# Patient Record
Sex: Male | Born: 1970 | Race: Black or African American | Hispanic: No | Marital: Married | Smoking: Never smoker
Health system: Southern US, Community
[De-identification: ages and names within clinical notes are randomized; demographics above are authoritative.]

## PROBLEM LIST (undated history)

## (undated) DIAGNOSIS — I10 Essential (primary) hypertension: Secondary | ICD-10-CM

## (undated) HISTORY — PX: LAPAROSCOPIC GASTRIC BANDING: SHX1100

## (undated) HISTORY — DX: Essential (primary) hypertension: I10

---

## 2006-07-25 ENCOUNTER — Ambulatory Visit: Payer: Self-pay | Admitting: Family Medicine

## 2006-12-06 ENCOUNTER — Ambulatory Visit: Payer: Self-pay | Admitting: Family Medicine

## 2006-12-13 ENCOUNTER — Ambulatory Visit: Payer: Self-pay | Admitting: Family Medicine

## 2014-04-18 ENCOUNTER — Emergency Department: Payer: Self-pay | Admitting: Emergency Medicine

## 2014-04-24 DIAGNOSIS — R635 Abnormal weight gain: Secondary | ICD-10-CM | POA: Insufficient documentation

## 2014-12-16 DIAGNOSIS — Z9884 Bariatric surgery status: Secondary | ICD-10-CM | POA: Insufficient documentation

## 2014-12-16 DIAGNOSIS — R632 Polyphagia: Secondary | ICD-10-CM | POA: Insufficient documentation

## 2015-09-17 ENCOUNTER — Encounter: Payer: Self-pay | Admitting: Internal Medicine

## 2015-10-06 ENCOUNTER — Encounter: Payer: Self-pay | Admitting: Internal Medicine

## 2015-10-13 ENCOUNTER — Encounter: Payer: Self-pay | Admitting: Internal Medicine

## 2017-12-07 DIAGNOSIS — I10 Essential (primary) hypertension: Secondary | ICD-10-CM | POA: Insufficient documentation

## 2017-12-14 ENCOUNTER — Ambulatory Visit: Payer: Self-pay | Admitting: Internal Medicine

## 2017-12-14 VITALS — BP 162/84 | HR 98 | Temp 98.9°F | Ht 69.0 in | Wt 294.4 lb

## 2017-12-14 DIAGNOSIS — F419 Anxiety disorder, unspecified: Secondary | ICD-10-CM

## 2017-12-14 DIAGNOSIS — I10 Essential (primary) hypertension: Secondary | ICD-10-CM

## 2017-12-14 DIAGNOSIS — F329 Major depressive disorder, single episode, unspecified: Secondary | ICD-10-CM

## 2017-12-14 DIAGNOSIS — F32A Depression, unspecified: Secondary | ICD-10-CM | POA: Insufficient documentation

## 2017-12-14 MED ORDER — AMLODIPINE BESYLATE 5 MG PO TABS: 5.0000 mg | ORAL_TABLET | Freq: Every day | ORAL | 0 refills | Status: DC

## 2017-12-14 MED ORDER — TELMISARTAN-HCTZ 80-25 MG PO TABS: 1.0000 | ORAL_TABLET | Freq: Every day | ORAL | 0 refills | Status: DC

## 2017-12-14 NOTE — Patient Instructions (Addendum)
Referred to the [facility] for counseling [employer]. [address] [phone]  Follow up with me 01/11/18  Take care   Hypertension Hypertension is another name for high blood pressure. High blood pressure forces your heart to work harder to pump blood. This can cause problems over time. There are two numbers in a blood pressure reading. There is a top number (systolic) over a bottom number (diastolic). It is best to have a blood pressure below 120/80. Healthy choices can help lower your blood pressure. You may need medicine to help lower your blood pressure if:  Your blood pressure cannot be lowered with healthy choices.  Your blood pressure is higher than 130/80.  Follow these instructions at home: Eating and drinking  If directed, follow the DASH eating plan. This diet includes: ? Filling half of your plate at each meal with fruits and vegetables. ? Filling one quarter of your plate at each meal with whole grains. Whole grains include whole wheat pasta, brown rice, and whole grain bread. ? Eating or drinking low-fat dairy products, such as skim milk or low-fat yogurt. ? Filling one quarter of your plate at each meal with low-fat (lean) proteins. Low-fat proteins include fish, skinless chicken, eggs, beans, and tofu. ? Avoiding fatty meat, cured and processed meat, or chicken with skin. ? Avoiding premade or processed food.  Eat less than 1,500 mg of salt (sodium) a day.  Limit alcohol use to no more than 1 drink a day for nonpregnant women and 2 drinks a day for men. One drink equals 12 oz of beer, 5 oz of wine, or 1 oz of hard liquor. Lifestyle  Work with your doctor to stay at a healthy weight or to lose weight. Ask your doctor what the best weight is for you.  Get at least 30 minutes of exercise that causes your heart to beat faster (aerobic exercise) most days of the week. This may include walking, swimming, or biking.  Get at least 30  minutes of exercise that strengthens your muscles (resistance exercise) at least 3 days a week. This may include lifting weights or pilates.  Do not use any products that contain nicotine or tobacco. This includes cigarettes and e-cigarettes. If you need help quitting, ask your doctor.  Check your blood pressure at home as told by your doctor.  Keep all follow-up visits as told by your doctor. This is important. Medicines  Take over-the-counter and prescription medicines only as told by your doctor. Follow directions carefully.  Do not skip doses of blood pressure medicine. The medicine does not work as well if you skip doses. Skipping doses also puts you at risk for problems.  Ask your doctor about side effects or reactions to medicines that you should watch for. Contact a doctor if:  You think you are having a reaction to the medicine you are taking.  You have headaches that keep coming back (recurring).  You feel dizzy.  You have swelling in your ankles.  You have trouble with your vision. Get help right away if:  You get a very bad headache.  You start to feel confused.  You feel weak or numb.  You feel faint.  You get very bad pain in your: ? Chest. ? Belly (abdomen).  You throw up (vomit) more than once.  You have trouble breathing. Summary  Hypertension is another name for high blood pressure.  Making healthy choices can help lower blood pressure. If your  blood pressure cannot be controlled with healthy choices, you may need to take medicine. This information is not intended to replace advice given to you by your health care provider. Make sure you discuss any questions you have with your health care provider. Document Released: 02/09/2008 Document Revised: 07/21/2016 Document Reviewed: 07/21/2016 Elsevier Interactive Patient Education  2018 [place] Eating Plan DASH stands for "Dietary Approaches to Stop Hypertension." The DASH eating plan is a  healthy eating plan that has been shown to reduce high blood pressure (hypertension). It may also reduce your risk for type 2 diabetes, heart disease, and stroke. The DASH eating plan may also help with weight loss. What are tips for following this plan? General guidelines  Avoid eating more than 2,300 mg (milligrams) of salt (sodium) a day. If you have hypertension, you may need to reduce your sodium intake to 1,500 mg a day.  Limit alcohol intake to no more than 1 drink a day for nonpregnant women and 2 drinks a day for men. One drink equals 12 oz of beer, 5 oz of wine, or 1 oz of hard liquor.  Work with your health care provider to maintain a healthy body weight or to lose weight. Ask what an ideal weight is for you.  Get at least 30 minutes of exercise that causes your heart to beat faster (aerobic exercise) most days of the week. Activities may include walking, swimming, or biking.  Work with your health care provider or diet and nutrition specialist (dietitian) to adjust your eating plan to your individual calorie needs. Reading food labels  Check food labels for the amount of sodium per serving. Choose foods with less than 5 percent of the Daily Value of sodium. Generally, foods with less than 300 mg of sodium per serving fit into this eating plan.  To find whole grains, look for the word "whole" as the first word in the ingredient list. Shopping  Buy products labeled as "low-sodium" or "no salt added."  Buy fresh foods. Avoid canned foods and premade or frozen meals. Cooking  Avoid adding salt when cooking. Use salt-free seasonings or herbs instead of table salt or sea salt. Check with your health care provider or pharmacist before using salt substitutes.  Do not fry foods. Cook foods using healthy methods such as baking, boiling, grilling, and broiling instead.  Cook with heart-healthy oils, such as olive, canola, soybean, or sunflower oil. Meal planning   Eat a balanced  diet that includes: ? 5 or more servings of fruits and vegetables each day. At each meal, try to fill half of your plate with fruits and vegetables. ? Up to 6-8 servings of whole grains each day. ? Less than 6 oz of lean meat, poultry, or fish each day. A 3-oz serving of meat is about the same size as a deck of cards. One egg equals 1 oz. ? 2 servings of low-fat dairy each day. ? A serving of nuts, seeds, or beans 5 times each week. ? Heart-healthy fats. Healthy fats called Omega-3 fatty acids are found in foods such as flaxseeds and coldwater fish, like sardines, salmon, and mackerel.  Limit how much you eat of the following: ? Canned or prepackaged foods. ? Food that is high in trans fat, such as fried foods. ? Food that is high in saturated fat, such as fatty meat. ? Sweets, desserts, sugary drinks, and other foods with added sugar. ? Full-fat dairy products.  Do not salt foods before eating.  Try to eat at least 2 vegetarian meals each week.  Eat more home-cooked food and less restaurant, buffet, and fast food.  When eating at a restaurant, ask that your food be prepared with less salt or no salt, if possible. What foods are recommended? The items listed may not be a complete list. Talk with your dietitian about what dietary choices are best for you. Grains Whole-grain or whole-wheat bread. Whole-grain or whole-wheat pasta. Brown rice. [place]. Bulgur. Whole-grain and low-sodium cereals. Pita bread. Low-fat, low-sodium crackers. Whole-wheat flour tortillas. Vegetables Fresh or frozen vegetables (raw, steamed, roasted, or grilled). Low-sodium or reduced-sodium tomato and vegetable juice. Low-sodium or reduced-sodium tomato sauce and tomato paste. Low-sodium or reduced-sodium canned vegetables. Fruits All fresh, dried, or frozen fruit. Canned fruit in natural juice (without added sugar). Meat and other protein foods Skinless chicken or [place]. Ground chicken or [place]. Pork  with fat trimmed off. Fish and seafood. Egg whites. Dried beans, peas, or lentils. Unsalted nuts, nut butters, and seeds. Unsalted canned beans. Lean cuts of beef with fat trimmed off. Low-sodium, lean deli meat. Dairy Low-fat (1%) or fat-free (skim) milk. Fat-free, low-fat, or reduced-fat cheeses. Nonfat, low-sodium ricotta or cottage cheese. Low-fat or nonfat yogurt. Low-fat, low-sodium cheese. Fats and oils Soft margarine without trans fats. Vegetable oil. Low-fat, reduced-fat, or light mayonnaise and salad dressings (reduced-sodium). Canola, safflower, olive, soybean, and sunflower oils. Avocado. Seasoning and other foods Herbs. Spices. Seasoning mixes without salt. Unsalted popcorn and pretzels. Fat-free sweets. What foods are not recommended? The items listed may not be a complete list. Talk with your dietitian about what dietary choices are best for you. Grains Baked goods made with fat, such as croissants, muffins, or some breads. Dry pasta or rice meal packs. Vegetables Creamed or fried vegetables. Vegetables in a cheese sauce. Regular canned vegetables (not low-sodium or reduced-sodium). Regular canned tomato sauce and paste (not low-sodium or reduced-sodium). Regular tomato and vegetable juice (not low-sodium or reduced-sodium). Angie Fava. Olives. Fruits Canned fruit in a light or heavy syrup. Fried fruit. Fruit in cream or butter sauce. Meat and other protein foods Fatty cuts of meat. Ribs. Fried meat. Berniece Salines. Sausage. Bologna and other processed lunch meats. Salami. Fatback. Hotdogs. Bratwurst. Salted nuts and seeds. Canned beans with added salt. Canned or smoked fish. Whole eggs or egg yolks. Chicken or [place] with skin. Dairy Whole or 2% milk, cream, and half-and-half. Whole or full-fat cream cheese. Whole-fat or sweetened yogurt. Full-fat cheese. Nondairy creamers. Whipped toppings. Processed cheese and cheese spreads. Fats and oils Butter. Stick margarine. Lard. Shortening. Ghee.  Bacon fat. Tropical oils, such as coconut, palm kernel, or palm oil. Seasoning and other foods Salted popcorn and pretzels. Onion salt, garlic salt, seasoned salt, table salt, and sea salt. Worcestershire sauce. Tartar sauce. Barbecue sauce. Teriyaki sauce. Soy sauce, including reduced-sodium. Steak sauce. Canned and packaged gravies. Fish sauce. Oyster sauce. Cocktail sauce. Horseradish that you find on the shelf. Ketchup. Mustard. Meat flavorings and tenderizers. Bouillon cubes. Hot sauce and Tabasco sauce. Premade or packaged marinades. Premade or packaged taco seasonings. Relishes. Regular salad dressings. Where to find more information:  National Heart, Lung, and [place]: https://wilson-eaton.com/  American Heart Association: www.heart.org Summary  The DASH eating plan is a healthy eating plan that has been shown to reduce high blood pressure (hypertension). It may also reduce your risk for type 2 diabetes, heart disease, and stroke.  With the DASH eating plan, you should limit salt (sodium) intake to 2,300 mg a day. If  you have hypertension, you may need to reduce your sodium intake to 1,500 mg a day.  When on the DASH eating plan, aim to eat more fresh fruits and vegetables, whole grains, lean proteins, low-fat dairy, and heart-healthy fats.  Work with your health care provider or diet and nutrition specialist (dietitian) to adjust your eating plan to your individual calorie needs. This information is not intended to replace advice given to you by your health care provider. Make sure you discuss any questions you have with your health care provider. Document Released: 08/12/2011 Document Revised: 08/16/2016 Document Reviewed: 08/16/2016 Elsevier Interactive Patient Education  [employer].

## 2017-12-14 NOTE — Progress Notes (Signed)
Pre visit review using our clinic review tool, if applicable. No additional management support is needed unless otherwise documented below in the visit note. 

## 2017-12-17 ENCOUNTER — Encounter: Payer: Self-pay | Admitting: Internal Medicine

## 2017-12-17 NOTE — Progress Notes (Addendum)
Chief Complaint  Patient presents with  . Establish Care   Establish care former [place] pt  1. HTN uncontrolled he took Losartan/HCTZ 100/25, norvasc 5 but reports he has not been taking medication consistently and received a letter that hyzaar was Product manager recall list  2. Reports anxiety due to work and mood changes PHQ 9 score 18 today. 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 He is agreeable to counselor but wants to seek an Economist. He does not want to try meds a this time   Review of Systems  Constitutional: Negative for weight loss.  HENT: Negative for hearing loss.   Eyes: Negative for blurred vision.  Respiratory: Negative for shortness of breath.   Cardiovascular: Negative for chest pain.  Gastrointestinal: Negative for abdominal pain.  Skin: Negative for rash.  Neurological: Negative for headaches.  Psychiatric/Behavioral: Positive for depression. The patient is nervous/anxious.    Past Medical History:  Diagnosis Date  . Hypertension    Past Surgical History:  Procedure Laterality Date  . LAPAROSCOPIC GASTRIC BANDING     Dr. Flavia Shipper [place] 2008    Family History  Problem Relation Age of Onset  . Arthritis Mother   . Hypertension Mother   . Alcohol abuse Father   . Diabetes Father   . Hypertension Brother   . Hypertension Brother    Social History   Socioeconomic History  . Marital status: Married    Spouse name: Not on file  . Number of children: Not on file  . Years of education: Not on file  . Highest education level: Not on file  Occupational History  . Not on file  Social Needs  . Financial resource strain: Not on file  . Food insecurity:    Worry: Not on file    Inability: Not on file  . Transportation needs:   Medical: Not on file    Non-medical: Not on file  Tobacco Use  . Smoking status: Never Smoker  . Smokeless tobacco: Never Used  Substance and Sexual Activity  . Alcohol use: Not Currently  . Drug use: Not Currently  . Sexual activity: Yes    Comment: wife   Lifestyle  . Physical activity:    Days per week: Not on file    Minutes per session: Not on file  . Stress: Not on file  Relationships  . Social connections:    Talks on phone: Not on file    Gets together: Not on file    Attends religious service: Not on file    Active member of club or organization: Not on file    Attends meetings of clubs or organizations: Not on file    Relationship status: Not on file  . Intimate partner violence:    Fear of current or ex partner: Not on file    Emotionally abused: Not on file    Physically abused: Not on file    Forced sexual activity: Not on file  Other Topics Concern  . Not on file  Social History Narrative   Wears seat belt, safe in relationship    Associates degree, works as Psychiatric nurse  Medication Sig  . amLODipine (NORVASC) 5 MG tablet Take  1 tablet (5 mg total) by mouth daily.  . [DISCONTINUED] amLODipine (NORVASC) 5 MG tablet TK 1 T PO QAM  . [DISCONTINUED] losartan-hydrochlorothiazide (HYZAAR) 100-25 MG tablet TK 1 T PO QAM   No Known Allergies No results found for this or any previous visit (from the past 2160 hour(s)). Objective  Body mass index is 43.48 kg/m. Wt Readings from Last 3 Encounters:  12/14/17 294 lb 6.4 oz (133.5 kg)   Temp Readings from Last 3 Encounters:  12/14/17 98.9 F (37.2 C) (Oral)   BP Readings from Last 3 Encounters:  12/14/17 (!) 162/84   Pulse Readings from Last 3 Encounters:  12/14/17 98    Physical Exam  Constitutional: He is oriented to person, place, and time. He appears well-developed and well-nourished.  HENT:  Head: Normocephalic and atraumatic.  Mouth/Throat: Oropharynx is clear and moist and mucous  membranes are normal.  Eyes: Pupils are equal, round, and reactive to light. Conjunctivae are normal.  Cardiovascular: Normal rate, regular rhythm and normal heart sounds.  Pulmonary/Chest: Effort normal and breath sounds normal.  Neurological: He is alert and oriented to person, place, and time. Gait normal.  Skin: Skin is warm, dry and intact.  Psychiatric: He has a normal mood and affect. His speech is normal and behavior is normal. Judgment and thought content normal. Cognition and memory are normal.  Nursing note and vitals reviewed.   Assessment   1. HTN  2. Anxiety and depression PHQ9 score 18  3. HM Plan  1. Change hyzaar 100/25 to telmisartan 80, hctz 25 and norvasc 5 mg qd  If this does not control BP will d/c hctz and change chlorthalidone  rec diet compliance with low salt, healthy diet options and exercise  2. Refer to [facility] for therapy Dr. Bishop Limbo  3.  Flu shot had  Will ask about Tdap at f/u  Pt was to call insurance 09/15/16 to see if covered cologaurd and PSA testing [phone] office   [facility] including labs and review consider check hep B status in future  -reviewed notes and labs  MCV 74.1 10/13/15 otherwise CBC normal,  09/17/15 TC 179, TG 90, HDL 53, LDL 108   CT ab/pelvis 10/06/15 small Hiatal hernia, s/p gastric lap band good position, fatty liver, b/l kidney cysts 2.25 R upper right and 1.5 cm left upper left, +diverticulosis, small umbilical hernia with fat.    Provider: Dr. Olivia Mackie McLean-[facility]oc[facility]

## 2018-01-11 ENCOUNTER — Encounter: Payer: Self-pay | Admitting: Internal Medicine

## 2018-01-11 ENCOUNTER — Ambulatory Visit: Payer: BLUE CROSS/BLUE SHIELD | Admitting: Internal Medicine

## 2018-01-11 VITALS — BP 144/80 | HR 73 | Temp 98.7°F | Ht 69.0 in | Wt 292.0 lb

## 2018-01-11 DIAGNOSIS — F329 Major depressive disorder, single episode, unspecified: Secondary | ICD-10-CM

## 2018-01-11 DIAGNOSIS — F419 Anxiety disorder, unspecified: Secondary | ICD-10-CM | POA: Diagnosis not present

## 2018-01-11 DIAGNOSIS — Z6841 Body Mass Index (BMI) 40.0 and over, adult: Secondary | ICD-10-CM | POA: Diagnosis not present

## 2018-01-11 DIAGNOSIS — I10 Essential (primary) hypertension: Secondary | ICD-10-CM

## 2018-01-11 NOTE — Progress Notes (Signed)
Pre visit review using our clinic review tool, if applicable. No additional management support is needed unless otherwise documented below in the visit note. 

## 2018-01-11 NOTE — Patient Instructions (Addendum)
Dr. Flavia Shipper referral at [facility] [phone] Follow up in 3-4 months   DASH Eating Plan DASH stands for "Dietary Approaches to Stop Hypertension." The DASH eating plan is a healthy eating plan that has been shown to reduce high blood pressure (hypertension). It may also reduce your risk for type 2 diabetes, heart disease, and stroke. The DASH eating plan may also help with weight loss. What are tips for following this plan? General guidelines  Avoid eating more than 2,300 mg (milligrams) of salt (sodium) a day. If you have hypertension, you may need to reduce your sodium intake to 1,500 mg a day.  Limit alcohol intake to no more than 1 drink a day for nonpregnant women and 2 drinks a day for men. One drink equals 12 oz of beer, 5 oz of wine, or 1 oz of hard liquor.  Work with your health care provider to maintain a healthy body weight or to lose weight. Ask what an ideal weight is for you.  Get at least 30 minutes of exercise that causes your heart to beat faster (aerobic exercise) most days of the week. Activities may include walking, swimming, or biking.  Work with your health care provider or diet and nutrition specialist (dietitian) to adjust your eating plan to your individual calorie needs. Reading food labels  Check food labels for the amount of sodium per serving. Choose foods with less than 5 percent of the Daily Value of sodium. Generally, foods with less than 300 mg of sodium per serving fit into this eating plan.  To find whole grains, look for the word "whole" as the first word in the ingredient list. Shopping  Buy products labeled as "low-sodium" or "no salt added."  Buy fresh foods. Avoid canned foods and premade or frozen meals. Cooking  Avoid adding salt when cooking. Use salt-free seasonings or herbs instead of table salt or sea salt. Check with your health care provider or pharmacist before using salt substitutes.  Do not fry foods. Cook foods using healthy  methods such as baking, boiling, grilling, and broiling instead.  Cook with heart-healthy oils, such as olive, canola, soybean, or sunflower oil. Meal planning   Eat a balanced diet that includes: ? 5 or more servings of fruits and vegetables each day. At each meal, try to fill half of your plate with fruits and vegetables. ? Up to 6-8 servings of whole grains each day. ? Less than 6 oz of lean meat, poultry, or fish each day. A 3-oz serving of meat is about the same size as a deck of cards. One egg equals 1 oz. ? 2 servings of low-fat dairy each day. ? A serving of nuts, seeds, or beans 5 times each week. ? Heart-healthy fats. Healthy fats called Omega-3 fatty acids are found in foods such as flaxseeds and coldwater fish, like sardines, salmon, and mackerel.  Limit how much you eat of the following: ? Canned or prepackaged foods. ? Food that is high in trans fat, such as fried foods. ? Food that is high in saturated fat, such as fatty meat. ? Sweets, desserts, sugary drinks, and other foods with added sugar. ? Full-fat dairy products.  Do not salt foods before eating.  Try to eat at least 2 vegetarian meals each week.  Eat more home-cooked food and less restaurant, buffet, and fast food.  When eating at a restaurant, ask that your food be prepared with less salt or no salt, if possible. What foods are recommended? The  items listed may not be a complete list. Talk with your dietitian about what dietary choices are best for you. Grains Whole-grain or whole-wheat bread. Whole-grain or whole-wheat pasta. Brown rice. [place]. Bulgur. Whole-grain and low-sodium cereals. Pita bread. Low-fat, low-sodium crackers. Whole-wheat flour tortillas. Vegetables Fresh or frozen vegetables (raw, steamed, roasted, or grilled). Low-sodium or reduced-sodium tomato and vegetable juice. Low-sodium or reduced-sodium tomato sauce and tomato paste. Low-sodium or reduced-sodium canned  vegetables. Fruits All fresh, dried, or frozen fruit. Canned fruit in natural juice (without added sugar). Meat and other protein foods Skinless chicken or [place]. Ground chicken or [place]. Pork with fat trimmed off. Fish and seafood. Egg whites. Dried beans, peas, or lentils. Unsalted nuts, nut butters, and seeds. Unsalted canned beans. Lean cuts of beef with fat trimmed off. Low-sodium, lean deli meat. Dairy Low-fat (1%) or fat-free (skim) milk. Fat-free, low-fat, or reduced-fat cheeses. Nonfat, low-sodium ricotta or cottage cheese. Low-fat or nonfat yogurt. Low-fat, low-sodium cheese. Fats and oils Soft margarine without trans fats. Vegetable oil. Low-fat, reduced-fat, or light mayonnaise and salad dressings (reduced-sodium). Canola, safflower, olive, soybean, and sunflower oils. Avocado. Seasoning and other foods Herbs. Spices. Seasoning mixes without salt. Unsalted popcorn and pretzels. Fat-free sweets. What foods are not recommended? The items listed may not be a complete list. Talk with your dietitian about what dietary choices are best for you. Grains Baked goods made with fat, such as croissants, muffins, or some breads. Dry pasta or rice meal packs. Vegetables Creamed or fried vegetables. Vegetables in a cheese sauce. Regular canned vegetables (not low-sodium or reduced-sodium). Regular canned tomato sauce and paste (not low-sodium or reduced-sodium). Regular tomato and vegetable juice (not low-sodium or reduced-sodium). Angie Fava. Olives. Fruits Canned fruit in a light or heavy syrup. Fried fruit. Fruit in cream or butter sauce. Meat and other protein foods Fatty cuts of meat. Ribs. Fried meat. Berniece Salines. Sausage. Bologna and other processed lunch meats. Salami. Fatback. Hotdogs. Bratwurst. Salted nuts and seeds. Canned beans with added salt. Canned or smoked fish. Whole eggs or egg yolks. Chicken or [place] with skin. Dairy Whole or 2% milk, cream, and half-and-half. Whole or full-fat  cream cheese. Whole-fat or sweetened yogurt. Full-fat cheese. Nondairy creamers. Whipped toppings. Processed cheese and cheese spreads. Fats and oils Butter. Stick margarine. Lard. Shortening. Ghee. Bacon fat. Tropical oils, such as coconut, palm kernel, or palm oil. Seasoning and other foods Salted popcorn and pretzels. Onion salt, garlic salt, seasoned salt, table salt, and sea salt. Worcestershire sauce. Tartar sauce. Barbecue sauce. Teriyaki sauce. Soy sauce, including reduced-sodium. Steak sauce. Canned and packaged gravies. Fish sauce. Oyster sauce. Cocktail sauce. Horseradish that you find on the shelf. Ketchup. Mustard. Meat flavorings and tenderizers. Bouillon cubes. Hot sauce and Tabasco sauce. Premade or packaged marinades. Premade or packaged taco seasonings. Relishes. Regular salad dressings. Where to find more information:  National Heart, Lung, and [place]: https://wilson-eaton.com/  American Heart Association: www.heart.org Summary  The DASH eating plan is a healthy eating plan that has been shown to reduce high blood pressure (hypertension). It may also reduce your risk for type 2 diabetes, heart disease, and stroke.  With the DASH eating plan, you should limit salt (sodium) intake to 2,300 mg a day. If you have hypertension, you may need to reduce your sodium intake to 1,500 mg a day.  When on the DASH eating plan, aim to eat more fresh fruits and vegetables, whole grains, lean proteins, low-fat dairy, and heart-healthy fats.  Work with your health care provider  or diet and nutrition specialist (dietitian) to adjust your eating plan to your individual calorie needs. This information is not intended to replace advice given to you by your health care provider. Make sure you discuss any questions you have with your health care provider. Document Released: 08/12/2011 Document Revised: 08/16/2016 Document Reviewed: 08/16/2016 Elsevier Interactive Patient Education  [employer].

## 2018-01-11 NOTE — Progress Notes (Signed)
Chief Complaint  Patient presents with  . Follow-up   F/u  1. HTN improved but not at goal on telmisartan 80-25 norvasc 5 mg qd and he feels better. He declines to change therapy for now and wants to try lifestyle changes. He reports h/o OSA prior to wt loss surgery and after lost wt not using cpap his wife does  2. Anxiety improved seen SEL therapist x 2  3 .obesity he wants to see Dr. Flavia Shipper [facility] again about wt loss options   Review of Systems  Constitutional: Positive for weight loss.       Down 2 lbs trying  HENT: Negative for hearing loss.   Eyes: Negative for blurred vision.  Respiratory: Negative for shortness of breath.   Cardiovascular: Negative for chest pain.  Psychiatric/Behavioral: The patient is nervous/anxious.        Anxiety improved    Past Medical History:  Diagnosis Date  . Hypertension    Past Surgical History:  Procedure Laterality Date  . LAPAROSCOPIC GASTRIC BANDING     Dr. Flavia Shipper [facility] 2008    Family History  Problem Relation Age of Onset  . Arthritis Mother   . Hypertension Mother   . Alcohol abuse Father   . Diabetes Father   . Hypertension Brother   . Hypertension Brother    Social History   Socioeconomic History  . Marital status: Married    Spouse name: Not on file  . Number of children: Not on file  . Years of education: Not on file  . Highest education level: Not on file  Occupational History  . Not on file  Social Needs  . Financial resource strain: Not on file  . Food insecurity:    Worry: Not on file    Inability: Not on file  . Transportation needs:    Medical: Not on file    Non-medical: Not on file  Tobacco Use  . Smoking status: Never Smoker  . Smokeless tobacco: Never Used  Substance and Sexual Activity  . Alcohol use: Not Currently  . Drug use: Not Currently  . Sexual activity: Yes    Comment: wife   Lifestyle  . Physical activity:    Days per week: Not on file    Minutes per session: Not on file  .  Stress: Not on file  Relationships  . Social connections:    Talks on phone: Not on file    Gets together: Not on file    Attends religious service: Not on file    Active member of club or organization: Not on file    Attends meetings of clubs or organizations: Not on file    Relationship status: Not on file  . Intimate partner violence:    Fear of current or ex partner: Not on file    Emotionally abused: Not on file    Physically abused: Not on file    Forced sexual activity: Not on file  Other Topics Concern  . Not on file  Social History Narrative   Wears seat belt, safe in relationship    Associates degree, works as Psychiatric nurse  Medication Sig  . amLODipine (NORVASC) 5 MG tablet Take 1 tablet (5 mg total) by mouth daily.  Marland Kitchen telmisartan-hydrochlorothiazide (MICARDIS HCT) 80-25 MG tablet Take 1 tablet by mouth daily.   No Known Allergies No results found for this or any previous visit (from the past 2160 hour(s)). Objective  Body mass index is  43.12 kg/m. Wt Readings from Last 3 Encounters:  01/11/18 292 lb (132.5 kg)  12/14/17 294 lb 6.4 oz (133.5 kg)   Temp Readings from Last 3 Encounters:  01/11/18 98.7 F (37.1 C) (Oral)  12/14/17 98.9 F (37.2 C) (Oral)   BP Readings from Last 3 Encounters:  01/11/18 (!) 144/80  12/14/17 (!) 162/84   Pulse Readings from Last 3 Encounters:  01/11/18 73  12/14/17 98    Physical Exam  Constitutional: He is oriented to person, place, and time. He appears well-developed and well-nourished. He is cooperative.  HENT:  Head: Normocephalic and atraumatic.  Mouth/Throat: Oropharynx is clear and moist and mucous membranes are normal.  Eyes: Pupils are equal, round, and reactive to light. Conjunctivae are normal.  Cardiovascular: Normal rate, regular rhythm and normal heart sounds.  Pulmonary/Chest: Effort normal and breath sounds normal.  Neurological: He is alert and oriented to person, place, and time. Gait  normal.  Skin: Skin is warm, dry and intact.  Psychiatric: He has a normal mood and affect. His speech is normal and behavior is normal. Judgment and thought content normal. Cognition and memory are normal.  Nursing note and vitals reviewed.   Assessment   1. HTN improved 2. Anxiety improved 3. Obesity BMI >43  4. HM Plan  1.  Disc low salt and exercise  Cont meds for now in future consider increase to chlorthalidone 25 mg qd pt likes idea of combined pills for easier compliance so will hold for now  F/u in 3-4 months  Get copy most recent labs [facility]  2. sel therapy x 2 sessions helping  3. Referred back to Dr. Hinton Dyer [place] [facility] to disc options he possibly is interested in another wt loss surgery  4.  Flu shot had  Tdap pt thinks he has had will check [facility] cologuard vs colonoscopy and PSA testing age 47 y.o   Obtained notes [facility]  Need to get most recently labs pt states had in 2018  consider check hep B status in future   -reviewed notes and labs  MCV 74.1 10/13/15 otherwise CBC normal,  09/17/15 TC 179, TG 90, HDL 53, LDL 108   CT ab/pelvis 10/06/15 small Hiatal hernia, s/p gastric lap band good position, fatty liver, b/l kidney cysts 2.25 R upper right and 1.5 cm left upper left, +diverticulosis, small umbilical hernia with fat.   Provider: Dr. Olivia Mackie McLean-Scocuzza-Internal Medicine

## 2018-01-18 NOTE — Progress Notes (Signed)
Patient is not in [employer].  Also [facility] has been informed to send over patient records.

## 2018-03-13 ENCOUNTER — Other Ambulatory Visit: Payer: Self-pay | Admitting: Internal Medicine

## 2018-03-13 DIAGNOSIS — I10 Essential (primary) hypertension: Secondary | ICD-10-CM

## 2018-03-13 MED ORDER — TELMISARTAN-HCTZ 80-25 MG PO TABS: 1.0000 | ORAL_TABLET | Freq: Every day | ORAL | 0 refills | Status: DC

## 2018-03-13 MED ORDER — AMLODIPINE BESYLATE 5 MG PO TABS: 5.0000 mg | ORAL_TABLET | Freq: Every day | ORAL | 0 refills | Status: DC

## 2018-03-14 ENCOUNTER — Telehealth: Payer: Self-pay

## 2018-03-14 NOTE — Telephone Encounter (Signed)
yes

## 2018-03-14 NOTE — Telephone Encounter (Signed)
Copied from [place] [phone]. Topic: Appointment Scheduling - Scheduling Inquiry for Clinic >> Mar 14, 2018 12:47 PM Synthia Innocent wrote: Reason for CRM: Requesting to be worked in on Monday 7/15 for cyst on L wrist. Please advise

## 2018-03-14 NOTE — Telephone Encounter (Signed)
Left voicemail for patient that he has been scheduled to see Dr. Aundra Dubin on 03-20-17 at 3:00 and to give the office a call if that date and time does not work for him

## 2018-03-20 ENCOUNTER — Ambulatory Visit: Payer: BLUE CROSS/BLUE SHIELD | Admitting: Internal Medicine

## 2018-03-20 DIAGNOSIS — R2232 Localized swelling, mass and lump, left upper limb: Secondary | ICD-10-CM | POA: Diagnosis not present

## 2018-05-16 ENCOUNTER — Ambulatory Visit: Payer: BLUE CROSS/BLUE SHIELD | Admitting: Internal Medicine

## 2018-05-16 DIAGNOSIS — Z0289 Encounter for other administrative examinations: Secondary | ICD-10-CM

## 2018-06-06 ENCOUNTER — Encounter: Payer: Self-pay | Admitting: Internal Medicine

## 2018-10-03 ENCOUNTER — Encounter: Payer: Self-pay | Admitting: Internal Medicine

## 2018-10-03 ENCOUNTER — Ambulatory Visit (INDEPENDENT_AMBULATORY_CARE_PROVIDER_SITE_OTHER): Payer: BLUE CROSS/BLUE SHIELD | Admitting: Internal Medicine

## 2018-10-03 VITALS — BP 138/66 | HR 98 | Temp 98.4°F | Ht 69.0 in | Wt 309.0 lb

## 2018-10-03 DIAGNOSIS — Z23 Encounter for immunization: Secondary | ICD-10-CM | POA: Diagnosis not present

## 2018-10-03 DIAGNOSIS — E291 Testicular hypofunction: Secondary | ICD-10-CM

## 2018-10-03 DIAGNOSIS — Z1159 Encounter for screening for other viral diseases: Secondary | ICD-10-CM

## 2018-10-03 DIAGNOSIS — K449 Diaphragmatic hernia without obstruction or gangrene: Secondary | ICD-10-CM

## 2018-10-03 DIAGNOSIS — Z0184 Encounter for antibody response examination: Secondary | ICD-10-CM

## 2018-10-03 DIAGNOSIS — E559 Vitamin D deficiency, unspecified: Secondary | ICD-10-CM

## 2018-10-03 DIAGNOSIS — I10 Essential (primary) hypertension: Secondary | ICD-10-CM | POA: Diagnosis not present

## 2018-10-03 DIAGNOSIS — N529 Male erectile dysfunction, unspecified: Secondary | ICD-10-CM | POA: Diagnosis not present

## 2018-10-03 DIAGNOSIS — K219 Gastro-esophageal reflux disease without esophagitis: Secondary | ICD-10-CM

## 2018-10-03 DIAGNOSIS — Z1329 Encounter for screening for other suspected endocrine disorder: Secondary | ICD-10-CM

## 2018-10-03 DIAGNOSIS — R739 Hyperglycemia, unspecified: Secondary | ICD-10-CM

## 2018-10-03 DIAGNOSIS — Z125 Encounter for screening for malignant neoplasm of prostate: Secondary | ICD-10-CM

## 2018-10-03 DIAGNOSIS — Z1389 Encounter for screening for other disorder: Secondary | ICD-10-CM

## 2018-10-03 MED ORDER — TELMISARTAN 80 MG PO TABS: 80.0000 mg | ORAL_TABLET | Freq: Every day | ORAL | 3 refills | Status: DC

## 2018-10-03 MED ORDER — PHENTERMINE HCL 37.5 MG PO TABS: 37.5000 mg | ORAL_TABLET | Freq: Every day | ORAL | 0 refills | Status: DC

## 2018-10-03 MED ORDER — AMLODIPINE BESYLATE 5 MG PO TABS: 5.0000 mg | ORAL_TABLET | Freq: Every day | ORAL | 3 refills | Status: DC

## 2018-10-03 MED ORDER — SILDENAFIL CITRATE 20 MG PO TABS: 20.0000 mg | ORAL_TABLET | ORAL | 0 refills | Status: DC | PRN

## 2018-10-03 MED ORDER — CHLORTHALIDONE 25 MG PO TABS: 25.0000 mg | ORAL_TABLET | Freq: Every day | ORAL | 3 refills | Status: DC

## 2018-10-03 NOTE — Progress Notes (Signed)
Chief Complaint  Patient presents with  . Follow-up   F/u pt was w/o insurance which is why missed appt  1. HTN on micardis hctz 80-25 and norvasc 5 mg qd BP 138/66  2. Morbid obesity s/p weight loss procedure at [facility] he wants to start a diet pill today to lose and has gained 17 lbs and is not good about diet though job is physical  3. H/o GERD and small hiatal hernia he denies issues with GERD  4. C/o ED wants medication    Review of Systems  Constitutional: Negative for weight loss.  HENT: Negative for hearing loss.   Eyes: Negative for blurred vision.  Respiratory: Negative for shortness of breath.   Cardiovascular: Negative for chest pain.  Gastrointestinal: Negative for abdominal pain.  Musculoskeletal: Negative for falls.  Skin: Negative for rash.  Neurological: Negative for headaches.  Psychiatric/Behavioral: Negative for depression and memory loss.   Past Medical History:  Diagnosis Date  . Hypertension    Past Surgical History:  Procedure Laterality Date  . LAPAROSCOPIC GASTRIC BANDING     Dr. Flavia Shipper [facility] 2008    Family History  Problem Relation Age of Onset  . Arthritis Mother   . Hypertension Mother   . Alcohol abuse Father   . Diabetes Father   . Hypertension Brother   . Hypertension Brother    Social History   Socioeconomic History  . Marital status: Married    Spouse name: Not on file  . Number of children: Not on file  . Years of education: Not on file  . Highest education level: Not on file  Occupational History  . Not on file  Social Needs  . Financial resource strain: Not on file  . Food insecurity:    Worry: Not on file    Inability: Not on file  . Transportation needs:    Medical: Not on file    Non-medical: Not on file  Tobacco Use  . Smoking status: Never Smoker  . Smokeless tobacco: Never Used  Substance and Sexual Activity  . Alcohol use: Not Currently  . Drug use: Not Currently  . Sexual activity: Yes    Comment: wife    Lifestyle  . Physical activity:    Days per week: Not on file    Minutes per session: Not on file  . Stress: Not on file  Relationships  . Social connections:    Talks on phone: Not on file    Gets together: Not on file    Attends religious service: Not on file    Active member of club or organization: Not on file    Attends meetings of clubs or organizations: Not on file    Relationship status: Not on file  . Intimate partner violence:    Fear of current or ex partner: Not on file    Emotionally abused: Not on file    Physically abused: Not on file    Forced sexual activity: Not on file  Other Topics Concern  . Not on file  Social History Narrative   Wears seat belt, safe in relationship    Associates degree, works as Product/process development scientist    Son and daughter    Current Meds  Medication Sig  . amLODipine (NORVASC) 5 MG tablet Take 1 tablet (5 mg total) by mouth daily.  Marland Kitchen telmisartan-hydrochlorothiazide (MICARDIS HCT) 80-25 MG tablet Take 1 tablet by mouth daily.   No Known Allergies No results found for  this or any previous visit (from the past 2160 hour(s)). Objective  Body mass index is 45.63 kg/m. Wt Readings from Last 3 Encounters:  10/03/18 (!) 309 lb (140.2 kg)  01/11/18 292 lb (132.5 kg)  12/14/17 294 lb 6.4 oz (133.5 kg)   Temp Readings from Last 3 Encounters:  10/03/18 98.4 F (36.9 C) (Oral)  01/11/18 98.7 F (37.1 C) (Oral)  12/14/17 98.9 F (37.2 C) (Oral)   BP Readings from Last 3 Encounters:  10/03/18 138/66  01/11/18 (!) 144/80  12/14/17 (!) 162/84   Pulse Readings from Last 3 Encounters:  10/03/18 98  01/11/18 73  12/14/17 98    Physical Exam Vitals signs and nursing note reviewed.  Constitutional:      Appearance: Normal appearance. He is morbidly obese.  HENT:     Head: Normocephalic and atraumatic.     Nose: Nose normal.     Mouth/Throat:     Mouth: Mucous membranes are moist.     Pharynx: Oropharynx is clear.  Eyes:      Conjunctiva/sclera: Conjunctivae normal.     Pupils: Pupils are equal, round, and reactive to light.  Cardiovascular:     Rate and Rhythm: Normal rate and regular rhythm.     Heart sounds: Normal heart sounds.  Pulmonary:     Effort: Pulmonary effort is normal.     Breath sounds: Normal breath sounds.  Skin:    General: Skin is warm and dry.  Neurological:     General: No focal deficit present.     Mental Status: He is alert and oriented to person, place, and time. Mental status is at baseline.     Gait: Gait normal.  Psychiatric:        Attention and Perception: Attention and perception normal.        Mood and Affect: Mood and affect normal.        Speech: Speech normal.        Behavior: Behavior normal. Behavior is cooperative.        Thought Content: Thought content normal.        Cognition and Memory: Cognition and memory normal.        Judgment: Judgment normal.     Assessment   1. HTN  2. Morbid obesity  3. GERD  4. HM 5. ED Plan   1. D/c telmisartan 80/hctz 25 change to telmisartan 80, chlorthalidone 25 mg qd cont norvasc 5 mg qd  sch fasting labs  2. rec exercise to lose  adipex 37.5 x 2 months f/u in 2 months  Thinking about surgery again 3. Monitor for sx's  4.  Flu shot had today  Tdap pt thinks he has had will check [facility]. Will get at f/u if due cologuard vs colonoscopy and PSA testing age 24 y.o disc at f/u  5. Generic viagra  Check testosterone  CT ab/pelvis 10/06/15 small Hiatal hernia, s/p gastric lap band good position, fatty liver, b/l kidney cysts 2.25 R upper right and 1.5 cm left upper left, +diverticulosis, small umbilical hernia with fat.  Provider: Dr. Olivia Mackie McLean-Scocuzza-Internal Medicine

## 2018-10-03 NOTE — Patient Instructions (Addendum)
Dr. Alinda Sierras Med weight loss surgeon  Dr. Flavia Shipper    Erectile Dysfunction Erectile dysfunction (ED) is the inability to get or keep an erection in order to have sexual intercourse. Erectile dysfunction may include:  Inability to get an erection.  Lack of enough hardness of the erection to allow penetration.  Loss of the erection before sex is finished. What are the causes? This condition may be caused by:  Certain medicines, such as: ? Pain relievers. ? Antihistamines. ? Antidepressants. ? Blood pressure medicines. ? Water pills (diuretics). ? Ulcer medicines. ? Muscle relaxants. ? Drugs.  Excessive drinking.  Psychological causes, such as: ? Anxiety. ? Depression. ? Sadness. ? Exhaustion. ? Performance fear. ? Stress.  Physical causes, such as: ? Artery problems. This may include diabetes, smoking, liver disease, or atherosclerosis. ? High blood pressure. ? Hormonal problems, such as low testosterone. ? Obesity. ? Nerve problems. This may include back or pelvic injuries, diabetes mellitus, multiple sclerosis, or Parkinson disease. What are the signs or symptoms? Symptoms of this condition include:  Inability to get an erection.  Lack of enough hardness of the erection to allow penetration.  Loss of the erection before sex is finished.  Normal erections at some times, but with frequent unsatisfactory episodes.  Low sexual satisfaction in either partner due to erection problems.  A curved penis occurring with erection. The curve may cause pain or the penis may be too curved to allow for intercourse.  Never having nighttime erections. How is this diagnosed? This condition is often diagnosed by:  Performing a physical exam to find other diseases or specific problems with the penis.  Asking you detailed questions about the problem.  Performing blood tests to check for diabetes mellitus or to measure hormone levels.  Performing other tests to check for  underlying health conditions.  Performing an ultrasound exam to check for scarring.  Performing a test to check blood flow to the penis.  Doing a sleep study at home to measure nighttime erections. How is this treated? This condition may be treated by:  Medicine taken by mouth to help you achieve an erection (oral medicine).  Hormone replacement therapy to replace low testosterone levels.  Medicine that is injected into the penis. Your health care provider may instruct you how to give yourself these injections at home.  Vacuum pump. This is a pump with a ring on it. The pump and ring are placed on the penis and used to create pressure that helps the penis become erect.  Penile implant surgery. In this procedure, you may receive: ? An inflatable implant. This consists of cylinders, a pump, and a reservoir. The cylinders can be inflated with a fluid that helps to create an erection, and they can be deflated after intercourse. ? A semi-rigid implant. This consists of two silicone rubber rods. The rods provide some rigidity. They are also flexible, so the penis can both curve downward in its normal position and become straight for sexual intercourse.  Blood vessel surgery, to improve blood flow to the penis. During this procedure, a blood vessel from a different part of the body is placed into the penis to allow blood to flow around (bypass) damaged or blocked blood vessels.  Lifestyle changes, such as exercising more, losing weight, and quitting smoking. Follow these instructions at home: Medicines   Take over-the-counter and prescription medicines only as told by your health care provider. Do not increase the dosage without first discussing it with your health care  provider.  If you are using self-injections, perform injections as directed by your health care provider. Make sure to avoid any veins that are on the surface of the penis. After giving an injection, apply pressure to the  injection site for 5 minutes. General instructions  Exercise regularly, as directed by your health care provider. Work with your health care provider to lose weight, if needed.  Do not use any products that contain nicotine or tobacco, such as cigarettes and e-cigarettes. If you need help quitting, ask your health care provider.  Before using a vacuum pump, read the instructions that come with the pump and discuss any questions with your health care provider.  Keep all follow-up visits as told by your health care provider. This is important. Contact a health care provider if:  You feel nauseous.  You vomit. Get help right away if:  You are taking oral or injectable medicines and you have an erection that lasts longer than 4 hours. If your health care provider is unavailable, go to the nearest emergency room for evaluation. An erection that lasts much longer than 4 hours can result in permanent damage to your penis.  You have severe pain in your groin or abdomen.  You develop redness or severe swelling of your penis.  You have redness spreading up into your groin or lower abdomen.  You are unable to urinate.  You experience chest pain or a rapid heart beat (palpitations) after taking oral medicines. Summary  Erectile dysfunction (ED) is the inability to get or keep an erection during sexual intercourse. This problem can usually be treated successfully.  This condition is diagnosed based on a physical exam, your symptoms, and tests to determine the cause. Treatment varies depending on the cause, and may include medicines, hormone therapy, surgery, or vacuum pump.  You may need follow-up visits to make sure that you are using your medicines or devices correctly.  Get help right away if you are taking or injecting medicines and you have an erection that lasts longer than 4 hours. This information is not intended to replace advice given to you by your health care provider. Make sure  you discuss any questions you have with your health care provider. Document Released: 08/20/2000 Document Revised: 09/08/2016 Document Reviewed: 09/08/2016 Elsevier Interactive Patient Education  2019 [place].   Phentermine tablets or capsules What is this medicine? PHENTERMINE (FEN ter meen) decreases your appetite. It is used with a reduced calorie diet and exercise to help you lose weight. This medicine may be used for other purposes; ask your health care provider or pharmacist if you have questions. COMMON BRAND NAME(S): Adipex-P, Atti-Plex P, Atti-Plex P Spansule, Fastin, Lomaira, Pro-Fast, Tara-8 What should I tell my health care provider before I take this medicine? They need to know if you have any of these conditions: -agitation or nervousness -diabetes -glaucoma -heart disease -high blood pressure -history of drug abuse or addiction -history of stroke -kidney disease -lung disease called Primary Pulmonary Hypertension (PPH) -taken an MAOI like Carbex, Eldepryl, Marplan, Nardil, or Parnate in last 14 days -taking stimulant medicines for attention disorders, weight loss, or to stay awake -thyroid disease -an unusual or allergic reaction to phentermine, other medicines, foods, dyes, or preservatives -pregnant or trying to get pregnant -breast-feeding How should I use this medicine? Take this medicine by mouth with a glass of water. Follow the directions on the prescription label. The instructions for use may differ based on the product and dose you are  taking. Avoid taking this medicine in the evening. It may interfere with sleep. Take your doses at regular intervals. Do not take your medicine more often than directed. Talk to your pediatrician regarding the use of this medicine in children. While this drug may be prescribed for children 17 years or older for selected conditions, precautions do apply. Overdosage: If you think you have taken too much of this medicine contact  a poison control center or emergency room at once. NOTE: This medicine is only for you. Do not share this medicine with others. What if I miss a dose? If you miss a dose, take it as soon as you can. If it is almost time for your next dose, take only that dose. Do not take double or extra doses. What may interact with this medicine? Do not take this medicine with any of the following medications: -MAOIs like Carbex, Eldepryl, Marplan, Nardil, and Parnate -medicines for colds or breathing difficulties like pseudoephedrine or phenylephrine -procarbazine -sibutramine -stimulant medicines for attention disorders, weight loss, or to stay awake This medicine may also interact with the following medications: -certain medicines for depression, anxiety, or psychotic disturbances -linezolid -medicines for diabetes -medicines for high blood pressure This list may not describe all possible interactions. Give your health care provider a list of all the medicines, herbs, non-prescription drugs, or dietary supplements you use. Also tell them if you smoke, drink alcohol, or use illegal drugs. Some items may interact with your medicine. What should I watch for while using this medicine? Notify your physician immediately if you become short of breath while doing your normal activities. Do not take this medicine within 6 hours of bedtime. It can keep you from getting to sleep. Avoid drinks that contain caffeine and try to stick to a regular bedtime every night. This medicine was intended to be used in addition to a healthy diet and exercise. The best results are achieved this way. This medicine is only indicated for short-term use. Eventually your weight loss may level out. At that point, the drug will only help you maintain your new weight. Do not increase or in any way change your dose without consulting your doctor. You may get drowsy or dizzy. Do not drive, use machinery, or do anything that needs mental  alertness until you know how this medicine affects you. Do not stand or sit up quickly, especially if you are an older patient. This reduces the risk of dizzy or fainting spells. Alcohol may increase dizziness and drowsiness. Avoid alcoholic drinks. What side effects may I notice from receiving this medicine? Side effects that you should report to your doctor or health care professional as soon as possible: -allergic reactions like skin rash, itching or hives, swelling of the face, lips, or tongue) -anxiety -breathing problems -changes in vision -chest pain or chest tightness -depressed mood or other mood changes -hallucinations, loss of contact with reality -fast, irregular heartbeat -increased blood pressure -irritable -nervousness or restlessness -painful urination -palpitations -tremors -trouble sleeping -seizures -signs and symptoms of a stroke like changes in vision; confusion; trouble speaking or understanding; severe headaches; sudden numbness or weakness of the face, arm or leg; trouble walking; dizziness; loss of balance or coordination -unusually weak or tired -vomiting Side effects that usually do not require medical attention (report to your doctor or health care professional if they continue or are bothersome): -constipation or diarrhea -dry mouth -headache -nausea -stomach upset -sweating This list may not describe all possible side effects. Call your doctor  for medical advice about side effects. You may report side effects to FDA at 1-800-FDA-1088. Where should I keep my medicine? Keep out of the reach of children. This medicine can be abused. Keep your medicine in a safe place to protect it from theft. Do not share this medicine with anyone. Selling or giving away this medicine is dangerous and against the law. This medicine may cause accidental overdose and death if taken by other adults, children, or pets. Mix any unused medicine with a substance like cat litter or  coffee grounds. Then throw the medicine away in a sealed container like a sealed bag or a coffee can with a lid. Do not use the medicine after the expiration date. Store at room temperature between 20 and 25 degrees C (68 and 77 degrees F). Keep container tightly closed. NOTE: This sheet is a summary. It may not cover all possible information. If you have questions about this medicine, talk to your doctor, pharmacist, or health care provider.  2019 Elsevier/Gold Standard (2017-02-04 08:23:13)

## 2018-11-27 ENCOUNTER — Other Ambulatory Visit: Payer: BLUE CROSS/BLUE SHIELD

## 2018-12-05 ENCOUNTER — Encounter: Payer: Self-pay | Admitting: Internal Medicine

## 2018-12-05 ENCOUNTER — Ambulatory Visit (INDEPENDENT_AMBULATORY_CARE_PROVIDER_SITE_OTHER): Payer: BLUE CROSS/BLUE SHIELD | Admitting: Internal Medicine

## 2018-12-05 DIAGNOSIS — N529 Male erectile dysfunction, unspecified: Secondary | ICD-10-CM

## 2018-12-05 DIAGNOSIS — I10 Essential (primary) hypertension: Secondary | ICD-10-CM

## 2018-12-05 DIAGNOSIS — F419 Anxiety disorder, unspecified: Secondary | ICD-10-CM | POA: Diagnosis not present

## 2018-12-05 MED ORDER — PHENTERMINE HCL 37.5 MG PO TABS: 37.5000 mg | ORAL_TABLET | Freq: Every day | ORAL | 0 refills | Status: DC

## 2018-12-05 MED ORDER — SILDENAFIL CITRATE 20 MG PO TABS: 20.0000 mg | ORAL_TABLET | ORAL | 11 refills | Status: DC | PRN

## 2018-12-05 MED ORDER — TELMISARTAN-HCTZ 80-25 MG PO TABS: 1.0000 | ORAL_TABLET | Freq: Every day | ORAL | 3 refills | Status: DC

## 2018-12-05 MED ORDER — AMLODIPINE BESYLATE 5 MG PO TABS: 5.0000 mg | ORAL_TABLET | Freq: Every day | ORAL | 3 refills | Status: DC

## 2018-12-05 NOTE — Progress Notes (Addendum)
Telephone Visit via Telephone Note  I connected with Darren Perkins on 12/05/18 at  8:30 AM EDT telephone application and verified that I am speaking with the correct person using two identifiers.  Location patient: home Location provider:work  Persons participating in the virtual visit: patient, provider, pts wife Vito Beg  I discussed the limitations of evaluation and management by telemedicine and the availability of in person appointments. The patient expressed understanding and agreed to proceed.   HPI: HTN- on norvasc 5 mg qd, micardis 80 mg qd, chlorthalidone 25 mg qd but not urinating as much wants to get back on micardis-hct 80-25 and norvasc 5 mg qd he needs meds #90 day supply to [facility]. He will try to be more diet compliant and has been walking   Obesity on adipex P 37.5 not taking qd but down to 297 when weighing himself on home scale today   ED revatio is helping and low cost from [facility] drug   Anxiety increased due to COVID 19 and disc. Reason for testing people currently pt w/o sx's     ROS: See pertinent positives and negatives per HPI.  Past Medical History:  Diagnosis Date  . Hypertension     Past Surgical History:  Procedure Laterality Date  . LAPAROSCOPIC GASTRIC BANDING     Dr. Flavia Shipper [facility] 2008     Family History  Problem Relation Age of Onset  . Arthritis Mother   . Hypertension Mother   . Alcohol abuse Father   . Diabetes Father   . Hypertension Brother   . Hypertension Brother     SOCIAL HX: married with kids, employed    Current Outpatient Medications:  .  amLODipine (NORVASC) 5 MG tablet, Take 1 tablet (5 mg total) by mouth daily., Disp: 90 tablet, Rfl: 3 .  phentermine (ADIPEX-P) 37.5 MG tablet, Take 1 tablet (37.5 mg total) by mouth daily before breakfast., Disp: 60 tablet, Rfl: 0 .  sildenafil (REVATIO) 20 MG tablet, Take 1-5 tablets (20-100 mg total) by mouth as needed. 30 min to 4 hrs before sex, Disp: 10 tablet, Rfl:  11 .  telmisartan-hydrochlorothiazide (MICARDIS HCT) 80-25 MG tablet, Take 1 tablet by mouth daily. In am, Disp: 90 tablet, Rfl: 3  EXAM: Weight 297 lbs today   VITALS per patient if applicable: unable today other than weight   Unable to assess as this was telephone encounter   PSYCH/NEURO: pleasant and cooperative, no obvious depression or anxiety, speech and thought processing grossly intact  ASSESSMENT AND PLAN:  Discussed the following assessment and plan:  Essential hypertension - Plan: amLODipine (NORVASC) 5 MG tablet, telmisartan-hydrochlorothiazide (MICARDIS HCT) 80-25 MG tablet D/c chlorthalidone 25 mg qd   Morbid obesity ([place]) - Plan: phentermine (ADIPEX-P) 37.5 MG tablet x 2 month supply then needs to break x 3 months  rec healthy diet and exercise   Erectile dysfunction, unspecified erectile dysfunction type - Plan: sildenafil (REVATIO) 20 MG tablet 1 year supply   Anxiety limit news and walk and try watching TV other than news     I discussed the assessment and treatment plan with the patient. The patient was provided an opportunity to ask questions and all were answered. The patient agreed with the plan and demonstrated an understanding of the instructions.   The patient was advised to call back or seek an in-person evaluation if the symptoms worsen or if the condition fails to improve as anticipated.  I provided 15 minutes of non-face-to-face time during this  encounter.   Nino Glow McLean-Scocuzza, MD

## 2018-12-13 ENCOUNTER — Telehealth: Payer: Self-pay | Admitting: Internal Medicine

## 2018-12-13 NOTE — Telephone Encounter (Signed)
sch f/u 05/2019  And wife Neeraj Housand same day if possible they like to come on the same day   Thanks [place]

## 2018-12-26 NOTE — Telephone Encounter (Signed)
Pt and his wife has been scheduled. Thank you!

## 2019-05-09 ENCOUNTER — Other Ambulatory Visit: Payer: Self-pay

## 2019-05-09 ENCOUNTER — Encounter: Payer: Self-pay | Admitting: Internal Medicine

## 2019-05-09 ENCOUNTER — Ambulatory Visit (INDEPENDENT_AMBULATORY_CARE_PROVIDER_SITE_OTHER): Payer: Self-pay | Admitting: Internal Medicine

## 2019-05-09 DIAGNOSIS — I10 Essential (primary) hypertension: Secondary | ICD-10-CM

## 2019-05-09 MED ORDER — AMLODIPINE BESYLATE 5 MG PO TABS: 5.0000 mg | ORAL_TABLET | Freq: Every day | ORAL | 3 refills | Status: DC

## 2019-05-09 MED ORDER — TELMISARTAN-HCTZ 80-25 MG PO TABS: 1.0000 | ORAL_TABLET | Freq: Every day | ORAL | 3 refills | Status: DC

## 2019-05-09 NOTE — Progress Notes (Signed)
Patient needs refills on amlodipine and telmisartan.

## 2019-05-09 NOTE — Progress Notes (Signed)
I called the patient he will come by the office to pick up his paperwork.

## 2019-05-09 NOTE — Progress Notes (Signed)
Telephone Note  I connected with Darren Perkins   on 05/09/19 at  8:30 AM EDT telephone and verified that I am speaking with the correct person using two identifiers.  Location patient: home Location provider:work or home office Persons participating in the virtual visit: patient, provider  I discussed the limitations of evaluation and management by telemedicine and the availability of in person appointments. The patient expressed understanding and agreed to proceed.   HPI: 1. HTN-norvasc 5 mg qd and micardis 80-24 mg qd  2. Obesity working out wt down to 295 lbs    ROS: See pertinent positives and negatives per HPI.  Past Medical History:  Diagnosis Date  . Hypertension     Past Surgical History:  Procedure Laterality Date  . LAPAROSCOPIC GASTRIC BANDING     Dr. Flavia Shipper [facility] 2008     Family History  Problem Relation Age of Onset  . Arthritis Mother   . Hypertension Mother   . Alcohol abuse Father   . Diabetes Father   . Hypertension Brother   . Hypertension Brother     SOCIAL HX: married with kids   Current Outpatient Medications:  .  amLODipine (NORVASC) 5 MG tablet, Take 1 tablet (5 mg total) by mouth daily., Disp: 90 tablet, Rfl: 3 .  phentermine (ADIPEX-P) 37.5 MG tablet, Take 1 tablet (37.5 mg total) by mouth daily before breakfast., Disp: 60 tablet, Rfl: 0 .  telmisartan-hydrochlorothiazide (MICARDIS HCT) 80-25 MG tablet, Take 1 tablet by mouth daily. In am, Disp: 90 tablet, Rfl: 3 .  sildenafil (REVATIO) 20 MG tablet, Take 1-5 tablets (20-100 mg total) by mouth as needed. 30 min to 4 hrs before sex (Patient not taking: Reported on 05/09/2019), Disp: 10 tablet, Rfl: 11  EXAM:  VITALS per patient if applicable:  GENERAL: alert, oriented, appears well and in no acute distress  PSYCH/NEURO: pleasant and cooperative, no obvious depression or anxiety, speech and thought processing grossly intact  ASSESSMENT AND PLAN:  Discussed the following assessment and  plan:  Essential hypertension - Plan: telmisartan-hydrochlorothiazide (MICARDIS HCT) 80-25 MG tablet, amLODipine (NORVASC) 5 MG tablet, DISCONTINUED: telmisartan-hydrochlorothiazide (MICARDIS HCT) 80-25 MG tablet, DISCONTINUED: amLODipine (NORVASC) 5 MG tablet -unable to do labs for now due to no insurance  -disc charity care today and will get more details   HM Flu shot needed  Tdap pt thinks he has had will check [facility]. Labs unable to due to do no insurance   cologuard vs colonoscopy and PSA testing age 63 disc will check into charity care    I discussed the assessment and treatment plan with the patient. The patient was provided an opportunity to ask questions and all were answered. The patient agreed with the plan and demonstrated an understanding of the instructions.   The patient was advised to call back or seek an in-person evaluation if the symptoms worsen or if the condition fails to improve as anticipated.  Time spent 15 minutes  Delorise Jackson, MD

## 2019-10-30 ENCOUNTER — Encounter: Payer: Self-pay | Admitting: Internal Medicine

## 2019-10-30 ENCOUNTER — Telehealth: Payer: Self-pay | Admitting: Internal Medicine

## 2019-10-30 ENCOUNTER — Other Ambulatory Visit: Payer: Self-pay

## 2019-10-30 ENCOUNTER — Ambulatory Visit (INDEPENDENT_AMBULATORY_CARE_PROVIDER_SITE_OTHER): Payer: BC Managed Care – PPO | Admitting: Internal Medicine

## 2019-10-30 VITALS — Ht 69.0 in | Wt 298.0 lb

## 2019-10-30 DIAGNOSIS — Z1389 Encounter for screening for other disorder: Secondary | ICD-10-CM

## 2019-10-30 DIAGNOSIS — I10 Essential (primary) hypertension: Secondary | ICD-10-CM

## 2019-10-30 DIAGNOSIS — E538 Deficiency of other specified B group vitamins: Secondary | ICD-10-CM

## 2019-10-30 DIAGNOSIS — G8929 Other chronic pain: Secondary | ICD-10-CM

## 2019-10-30 DIAGNOSIS — Z9889 Other specified postprocedural states: Secondary | ICD-10-CM

## 2019-10-30 DIAGNOSIS — Z1329 Encounter for screening for other suspected endocrine disorder: Secondary | ICD-10-CM

## 2019-10-30 DIAGNOSIS — E611 Iron deficiency: Secondary | ICD-10-CM

## 2019-10-30 DIAGNOSIS — N529 Male erectile dysfunction, unspecified: Secondary | ICD-10-CM | POA: Diagnosis not present

## 2019-10-30 DIAGNOSIS — E559 Vitamin D deficiency, unspecified: Secondary | ICD-10-CM

## 2019-10-30 DIAGNOSIS — Z1211 Encounter for screening for malignant neoplasm of colon: Secondary | ICD-10-CM

## 2019-10-30 DIAGNOSIS — M25562 Pain in left knee: Secondary | ICD-10-CM | POA: Diagnosis not present

## 2019-10-30 DIAGNOSIS — R739 Hyperglycemia, unspecified: Secondary | ICD-10-CM

## 2019-10-30 DIAGNOSIS — Z125 Encounter for screening for malignant neoplasm of prostate: Secondary | ICD-10-CM

## 2019-10-30 MED ORDER — LOSARTAN POTASSIUM-HCTZ 100-25 MG PO TABS: 1.0000 | ORAL_TABLET | Freq: Every day | ORAL | 3 refills | Status: DC

## 2019-10-30 MED ORDER — SILDENAFIL CITRATE 20 MG PO TABS: 20.0000 mg | ORAL_TABLET | ORAL | 11 refills | Status: DC | PRN

## 2019-10-30 MED ORDER — AMLODIPINE BESYLATE 5 MG PO TABS: 5.0000 mg | ORAL_TABLET | Freq: Every day | ORAL | 3 refills | Status: DC

## 2019-10-30 NOTE — Addendum Note (Signed)
Addended byElpidio Galea T on: 10/30/2019 03:41 PM   Modules accepted: Orders

## 2019-10-30 NOTE — Telephone Encounter (Signed)
Pt needs the losartan-hydrochlorothiazide (HYZAAR) 100-25 MG tablet and amLODipine (NORVASC) 5 MG tablet sent to [facility]

## 2019-10-30 NOTE — Progress Notes (Signed)
Virtual Visit via Video Note  I connected with Darren Perkins  on 10/30/19 at 12:00 PM EST by a video enabled telemedicine application and verified that I am speaking with the correct person using two identifiers.  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 The patient expressed understanding and agreed to proceed.   HPI: 1. HTN on hyzaar 100-25 mg qd norvasc 5 mg qd out of hyzaar 1 month or so due to cost not checking BP  2. Left knee pain since 2020 heard a pop and having to wear a knee brace 4-5/10 will get Xray of left knee     ROS: See pertinent positives and negatives per HPI. Denies h/a, chest pain, sob   Past Medical History:  Diagnosis Date  . Hypertension     Past Surgical History:  Procedure Laterality Date  . LAPAROSCOPIC GASTRIC BANDING     Dr. Flavia Shipper [facility] 2008     Family History  Problem Relation Age of Onset  . Arthritis Mother   . Hypertension Mother   . Alcohol abuse Father   . Diabetes Father   . Hypertension Brother   . 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  amLODipine (NORVASC) 5 MG tablet, Take 1 tablet (5 mg total) by mouth daily., Disp: 90 tablet, Rfl: 3 .  losartan-hydrochlorothiazide (HYZAAR) 100-25 MG tablet, Take 1 tablet by mouth daily. In am, Disp: 90 tablet, Rfl: 3 .  phentermine (ADIPEX-P) 37.5 MG tablet, Take 1 tablet (37.5 mg total) by mouth daily before breakfast. (Patient not taking: Reported on 10/30/2019), Disp: 60 tablet, Rfl: 0 .  sildenafil (REVATIO) 20 MG tablet, Take 1-5 tablets (20-100 mg total) by mouth as needed. 30 min  to 4 hrs before sex, Disp: 10 tablet, Rfl: 11  EXAM:  VITALS per patient if applicable:  GENERAL: alert, oriented, appears well and in no acute distress  HEENT: atraumatic, conjunttiva clear, no obvious abnormalities on inspection of external nose and ears  NECK: normal movements of the head and neck  LUNGS: on inspection no signs of respiratory distress, breathing rate appears normal, no obvious gross SOB, gasping or wheezing  CV: no obvious cyanosis  MS: moves all visible extremities without noticeable abnormality  PSYCH/NEURO: pleasant and cooperative, no obvious depression or anxiety, speech and thought processing grossly intact  ASSESSMENT AND PLAN:  Discussed the following assessment and plan:  Chronic pain of left knee - Plan: DG Knee Complete 4 Views Left, Ambulatory referral to Orthopedic Surgery  Essential hypertension - Plan: losartan-hydrochlorothiazide (HYZAAR) 100-25 MG tablet, amLODipine (NORVASC) 5 MG tablet Get BP cuff  Check BP with labs same day   Erectile dysfunction, unspecified erectile dysfunction type - Plan: sildenafil (REVATIO) 20 MG tablet  Morbid obesity ([facility]CC) rec healthy diet and exercise portion control   Encounter for screening colonoscopy - Plan: Ambulatory referral to Gastroenterology  S/P gastric surgery - Plan: Ambulatory referral to Gastroenterology Check labs   HM Flu shot not had 2020/21  covid vaccine considering  Tdap pt thinks he has had will check [facility]. Labs unable to due to do no insurance  fasting labs asap  cologuard vs colonoscopy referred [facility]   PSA testing now  rec healthy diet and exercise   -we discussed possible serious and likely etiologies, options for evaluation and workup, limitations of telemedicine visit vs in person visit, treatment, treatment risks and precautions. Pt prefers to treat via telemedicine empirically rather then risking or undertaking an in person visit at this moment. Patient agrees to seek  prompt in person care if worsening, new symptoms arise, or if is not improving with treatment.   I discussed the assessment and treatment plan with the patient. The patient was provided an opportunity to ask questions and all were answered. The patient agreed with the plan and demonstrated an understanding of the instructions.   The patient was advised to call back or seek an in-person evaluation if the symptoms worsen or if the condition fails to improve as anticipated.  Time spent 30 minutes  Delorise Jackson, MD

## 2019-11-02 ENCOUNTER — Ambulatory Visit
Admission: RE | Admit: 2019-11-02 | Discharge: 2019-11-02 | Disposition: A | Discharge status: Home / Self Care | Payer: BC Managed Care – PPO | Source: Ambulatory Visit | Attending: Internal Medicine | Admitting: Internal Medicine

## 2019-11-02 ENCOUNTER — Other Ambulatory Visit: Payer: Self-pay

## 2019-11-02 DIAGNOSIS — M25462 Effusion, left knee: Secondary | ICD-10-CM | POA: Diagnosis not present

## 2019-11-02 DIAGNOSIS — M25562 Pain in left knee: Secondary | ICD-10-CM | POA: Diagnosis not present

## 2019-11-02 DIAGNOSIS — G8929 Other chronic pain: Secondary | ICD-10-CM | POA: Diagnosis not present

## 2019-11-04 MED ORDER — AMLODIPINE BESYLATE 5 MG PO TABS: 5.0000 mg | ORAL_TABLET | Freq: Every day | ORAL | 3 refills | Status: DC

## 2019-11-04 MED ORDER — LOSARTAN POTASSIUM-HCTZ 100-25 MG PO TABS: 1.0000 | ORAL_TABLET | Freq: Every day | ORAL | 3 refills | Status: DC

## 2019-11-04 NOTE — Addendum Note (Signed)
Addended by: Orland Mustard on: 11/04/2019 09:06 PM   Modules accepted: Orders

## 2019-11-08 ENCOUNTER — Ambulatory Visit: Payer: Self-pay | Admitting: Internal Medicine

## 2019-11-12 ENCOUNTER — Other Ambulatory Visit: Payer: Self-pay | Admitting: Internal Medicine

## 2019-11-12 ENCOUNTER — Encounter: Payer: Self-pay | Admitting: Internal Medicine

## 2019-11-12 ENCOUNTER — Other Ambulatory Visit (INDEPENDENT_AMBULATORY_CARE_PROVIDER_SITE_OTHER): Payer: BC Managed Care – PPO

## 2019-11-12 ENCOUNTER — Other Ambulatory Visit: Payer: Self-pay

## 2019-11-12 DIAGNOSIS — E611 Iron deficiency: Secondary | ICD-10-CM

## 2019-11-12 DIAGNOSIS — Z1329 Encounter for screening for other suspected endocrine disorder: Secondary | ICD-10-CM | POA: Diagnosis not present

## 2019-11-12 DIAGNOSIS — R739 Hyperglycemia, unspecified: Secondary | ICD-10-CM

## 2019-11-12 DIAGNOSIS — Z1389 Encounter for screening for other disorder: Secondary | ICD-10-CM

## 2019-11-12 DIAGNOSIS — E538 Deficiency of other specified B group vitamins: Secondary | ICD-10-CM

## 2019-11-12 DIAGNOSIS — E559 Vitamin D deficiency, unspecified: Secondary | ICD-10-CM | POA: Diagnosis not present

## 2019-11-12 DIAGNOSIS — Z125 Encounter for screening for malignant neoplasm of prostate: Secondary | ICD-10-CM

## 2019-11-12 DIAGNOSIS — I10 Essential (primary) hypertension: Secondary | ICD-10-CM | POA: Diagnosis not present

## 2019-11-12 DIAGNOSIS — Z9889 Other specified postprocedural states: Secondary | ICD-10-CM | POA: Diagnosis not present

## 2019-11-12 DIAGNOSIS — R7303 Prediabetes: Secondary | ICD-10-CM | POA: Insufficient documentation

## 2019-11-12 LAB — COMPREHENSIVE METABOLIC PANEL
ALT: 23 U/L (ref 0–53)
AST: 20 U/L (ref 0–37)
Albumin: 4.4 g/dL (ref 3.5–5.2)
Alkaline Phosphatase: 75 U/L (ref 39–117)
BUN: 11 mg/dL (ref 6–23)
CO2: 29 mEq/L (ref 19–32)
Calcium: 9.8 mg/dL (ref 8.4–10.5)
Chloride: 103 mEq/L (ref 96–112)
Creatinine, Ser: 0.97 mg/dL (ref 0.40–1.50)
GFR: 98.81 mL/min (ref >60.00)
Glucose, Bld: 97 mg/dL (ref 70–99)
Potassium: 3.7 mEq/L (ref 3.5–5.1)
Sodium: 139 mEq/L (ref 135–145)
Total Bilirubin: 0.6 mg/dL (ref 0.2–1.2)
Total Protein: 7.1 g/dL (ref 6.0–8.3)

## 2019-11-12 LAB — CBC WITH DIFFERENTIAL/PLATELET
Basophils Absolute: 0.1 10*3/uL (ref 0.0–0.1)
Basophils Relative: 1 % (ref 0.0–3.0)
Eosinophils Absolute: 0.2 10*3/uL (ref 0.0–0.7)
Eosinophils Relative: 2.2 % (ref 0.0–5.0)
HCT: 40.2 % (ref 39.0–52.0)
Hemoglobin: 13.4 g/dL (ref 13.0–17.0)
Lymphocytes Relative: 16.2 % (ref 12.0–46.0)
Lymphs Abs: 1.4 10*3/uL (ref 0.7–4.0)
MCHC: 33.3 g/dL (ref 30.0–36.0)
MCV: 76.4 fl — ABNORMAL LOW (ref 78.0–100.0)
Monocytes Absolute: 0.8 10*3/uL (ref 0.1–1.0)
Monocytes Relative: 9.5 % (ref 3.0–12.0)
Neutro Abs: 6.2 10*3/uL (ref 1.4–7.7)
Neutrophils Relative %: 71.1 % (ref 43.0–77.0)
Platelets: 313 10*3/uL (ref 150.0–400.0)
RBC: 5.26 Mil/uL (ref 4.22–5.81)
RDW: 16.2 % — ABNORMAL HIGH (ref 11.5–15.5)
WBC: 8.8 10*3/uL (ref 4.0–10.5)

## 2019-11-12 LAB — LIPID PANEL
Cholesterol: 160 mg/dL (ref 0–200)
HDL: 48.2 mg/dL (ref >39.00)
LDL Cholesterol: 101 mg/dL — ABNORMAL HIGH (ref 0–99)
NonHDL: 112.19
Total CHOL/HDL Ratio: 3
Triglycerides: 55 mg/dL (ref 0.0–149.0)
VLDL: 11 mg/dL (ref 0.0–40.0)

## 2019-11-12 LAB — HEMOGLOBIN A1C: Hgb A1c MFr Bld: 6.1 % (ref 4.6–6.5)

## 2019-11-12 LAB — VITAMIN D 25 HYDROXY (VIT D DEFICIENCY, FRACTURES): VITD: 14.9 ng/mL — ABNORMAL LOW (ref 30.00–100.00)

## 2019-11-12 LAB — PSA: PSA: 0.22 ng/mL (ref 0.10–4.00)

## 2019-11-12 LAB — VITAMIN B12: Vitamin B-12: 338 pg/mL (ref 211–911)

## 2019-11-12 LAB — TSH: TSH: 0.73 u[IU]/mL (ref 0.35–4.50)

## 2019-11-12 MED ORDER — CHOLECALCIFEROL 1.25 MG (50000 UT) PO CAPS: 50000.0000 [IU] | ORAL_CAPSULE | ORAL | 1 refills | Status: DC

## 2019-11-13 LAB — URINALYSIS, ROUTINE W REFLEX MICROSCOPIC
Bilirubin Urine: NEGATIVE
Glucose, UA: NEGATIVE
Hgb urine dipstick: NEGATIVE
Ketones, ur: NEGATIVE
Leukocytes,Ua: NEGATIVE
Nitrite: NEGATIVE
Protein, ur: NEGATIVE
Specific Gravity, Urine: 1.012 (ref 1.001–1.03)
pH: >=8.5 — AB (ref 5.0–8.0)

## 2019-11-13 LAB — IRON,TIBC AND FERRITIN PANEL
%SAT: 14 % (calc) — ABNORMAL LOW (ref 20–48)
Ferritin: 76 ng/mL (ref 38–380)
Iron: 48 ug/dL — ABNORMAL LOW (ref 50–180)
TIBC: 335 mcg/dL (calc) (ref 250–425)

## 2019-11-14 DIAGNOSIS — M1712 Unilateral primary osteoarthritis, left knee: Secondary | ICD-10-CM | POA: Diagnosis not present

## 2019-11-23 ENCOUNTER — Ambulatory Visit: Payer: BC Managed Care – PPO | Attending: Internal Medicine

## 2019-11-23 DIAGNOSIS — Z23 Encounter for immunization: Secondary | ICD-10-CM

## 2019-11-23 NOTE — Progress Notes (Signed)
   Covid-19 Vaccination Clinic  Name:  Darren Perkins    MRN: [id number] DOB: [birth year]  11/23/2019  [name] was observed post Covid-19 immunization for 15 minutes without incident. He was provided with Vaccine Information Sheet and instruction to access the V-Safe system.   [name] was instructed to call 911 with any severe reactions post vaccine: Marland Kitchen Difficulty breathing  . Swelling of face and throat  . A fast heartbeat  . A bad rash all over body  . Dizziness and weakness   Immunizations Administered    Name Date Dose VIS Date Route   Pfizer COVID-19 Vaccine 11/23/2019  5:10 PM 0.3 mL 08/17/2019 Intramuscular   Manufacturer: [place]   Lot: [id number]   [place]: [id number]

## 2019-12-12 DIAGNOSIS — Z6841 Body Mass Index (BMI) 40.0 and over, adult: Secondary | ICD-10-CM | POA: Diagnosis not present

## 2019-12-12 DIAGNOSIS — Z1211 Encounter for screening for malignant neoplasm of colon: Secondary | ICD-10-CM | POA: Diagnosis not present

## 2019-12-12 DIAGNOSIS — Z9884 Bariatric surgery status: Secondary | ICD-10-CM | POA: Diagnosis not present

## 2019-12-14 ENCOUNTER — Ambulatory Visit: Payer: BC Managed Care – PPO | Attending: Internal Medicine

## 2019-12-14 DIAGNOSIS — Z23 Encounter for immunization: Secondary | ICD-10-CM

## 2019-12-14 NOTE — Progress Notes (Signed)
   Covid-19 Vaccination Clinic  Name:  CAMARA BATTISTINI    MRN: [id number] DOB: 06-10-1971  12/14/2019  Mr. Demichael was observed post Covid-19 immunization for 15 minutes without incident. He was provided with Vaccine Information Sheet and instruction to access the V-Safe system.   Mr. Chezem was instructed to call 911 with any severe reactions post vaccine: Marland Kitchen Difficulty breathing  . Swelling of face and throat  . A fast heartbeat  . A bad rash all over body  . Dizziness and weakness   Immunizations Administered    Name Date Dose VIS Date Route   Pfizer COVID-19 Vaccine 12/14/2019  3:31 PM 0.3 mL 08/17/2019 Intramuscular   Manufacturer: [place]   Lot: [phone] [id number]   [place]: [id number]

## 2020-03-13 ENCOUNTER — Other Ambulatory Visit
Admission: RE | Admit: 2020-03-13 | Discharge: 2020-03-13 | Disposition: A | Discharge status: Home / Self Care | Payer: BC Managed Care – PPO | Source: Ambulatory Visit | Attending: Internal Medicine | Admitting: Internal Medicine

## 2020-03-13 ENCOUNTER — Other Ambulatory Visit: Payer: Self-pay

## 2020-03-13 DIAGNOSIS — Z01812 Encounter for preprocedural laboratory examination: Secondary | ICD-10-CM | POA: Insufficient documentation

## 2020-03-13 DIAGNOSIS — Z20822 Contact with and (suspected) exposure to covid-19: Secondary | ICD-10-CM | POA: Diagnosis not present

## 2020-03-13 LAB — SARS CORONAVIRUS 2 (TAT 6-24 HRS): SARS Coronavirus 2: NEGATIVE

## 2020-03-17 ENCOUNTER — Ambulatory Visit
Admission: RE | Admit: 2020-03-17 | Discharge: 2020-03-17 | Disposition: A | Discharge status: Home / Self Care | Payer: BC Managed Care – PPO | Attending: Internal Medicine | Admitting: Internal Medicine

## 2020-03-17 ENCOUNTER — Encounter: Payer: Self-pay | Admitting: Internal Medicine

## 2020-03-17 ENCOUNTER — Ambulatory Visit: Payer: BC Managed Care – PPO | Admitting: Certified Registered"

## 2020-03-17 ENCOUNTER — Encounter
Admission: RE | Disposition: A | Discharge status: Home / Self Care | Payer: Self-pay | Source: Home / Self Care | Attending: Internal Medicine

## 2020-03-17 DIAGNOSIS — Z1211 Encounter for screening for malignant neoplasm of colon: Secondary | ICD-10-CM | POA: Insufficient documentation

## 2020-03-17 DIAGNOSIS — K640 First degree hemorrhoids: Secondary | ICD-10-CM | POA: Diagnosis not present

## 2020-03-17 DIAGNOSIS — K5730 Diverticulosis of large intestine without perforation or abscess without bleeding: Secondary | ICD-10-CM | POA: Diagnosis not present

## 2020-03-17 DIAGNOSIS — Z6841 Body Mass Index (BMI) 40.0 and over, adult: Secondary | ICD-10-CM | POA: Insufficient documentation

## 2020-03-17 DIAGNOSIS — K635 Polyp of colon: Secondary | ICD-10-CM | POA: Diagnosis not present

## 2020-03-17 DIAGNOSIS — K5790 Diverticulosis of intestine, part unspecified, without perforation or abscess without bleeding: Secondary | ICD-10-CM | POA: Diagnosis not present

## 2020-03-17 DIAGNOSIS — D123 Benign neoplasm of transverse colon: Secondary | ICD-10-CM | POA: Insufficient documentation

## 2020-03-17 DIAGNOSIS — I10 Essential (primary) hypertension: Secondary | ICD-10-CM | POA: Diagnosis not present

## 2020-03-17 DIAGNOSIS — K648 Other hemorrhoids: Secondary | ICD-10-CM | POA: Diagnosis not present

## 2020-03-17 HISTORY — PX: COLONOSCOPY WITH PROPOFOL: SHX5780

## 2020-03-17 LAB — HM COLONOSCOPY

## 2020-03-17 SURGERY — COLONOSCOPY WITH PROPOFOL
Anesthesia: General

## 2020-03-17 MED ORDER — MIDAZOLAM HCL 2 MG/2ML IJ SOLN
INTRAMUSCULAR | Status: DC | PRN
  Administered 2020-03-17: 2 mg via INTRAVENOUS

## 2020-03-17 MED ORDER — GLYCOPYRROLATE 0.2 MG/ML IJ SOLN
INTRAMUSCULAR | Status: AC
  Filled 2020-03-17: qty 1

## 2020-03-17 MED ORDER — PROPOFOL 10 MG/ML IV BOLUS
INTRAVENOUS | Status: DC | PRN
  Administered 2020-03-17: 50 mg via INTRAVENOUS
  Administered 2020-03-17: 20 mg via INTRAVENOUS
  Administered 2020-03-17: 10 mg via INTRAVENOUS

## 2020-03-17 MED ORDER — PROPOFOL 500 MG/50ML IV EMUL
INTRAVENOUS | Status: DC | PRN
  Administered 2020-03-17: 155 ug/kg/min via INTRAVENOUS

## 2020-03-17 MED ORDER — MIDAZOLAM HCL 2 MG/2ML IJ SOLN
INTRAMUSCULAR | Status: AC
  Filled 2020-03-17: qty 2

## 2020-03-17 MED ORDER — LIDOCAINE HCL (CARDIAC) PF 100 MG/5ML IV SOSY
PREFILLED_SYRINGE | INTRAVENOUS | Status: DC | PRN
  Administered 2020-03-17: 100 mg via INTRAVENOUS

## 2020-03-17 MED ORDER — SODIUM CHLORIDE 0.9 % IV SOLN
INTRAVENOUS | Status: DC
  Administered 2020-03-17: 1000 mL via INTRAVENOUS

## 2020-03-17 NOTE — Anesthesia Procedure Notes (Signed)
Procedure Name: General with mask airway Performed by: Fletcher-Harrison, Jaina Morin, CRNA Pre-anesthesia Checklist: Patient identified, Emergency Drugs available, Suction available and Patient being monitored Patient Re-evaluated:Patient Re-evaluated prior to induction Oxygen Delivery Method: Simple face mask Induction Type: IV induction Placement Confirmation: positive ETCO2 and CO2 detector Dental Injury: Teeth and Oropharynx as per pre-operative assessment        

## 2020-03-17 NOTE — Anesthesia Postprocedure Evaluation (Signed)
Anesthesia Post Note  Patient: Darren Perkins  Procedure(s) Performed: COLONOSCOPY WITH PROPOFOL (N/A )  Patient location during evaluation: Endoscopy Anesthesia Type: General Level of consciousness: awake and alert Pain management: pain level controlled Vital Signs Assessment: post-procedure vital signs reviewed and stable Respiratory status: spontaneous breathing and respiratory function stable Cardiovascular status: stable Anesthetic complications: no   No complications documented.   Last Vitals:  Vitals:   03/17/20 1544 03/17/20 1554  BP: (!) 143/61 114/73  Pulse: 74 78  Resp: (!) 21 20  Temp:    SpO2: 97% 99%    Last Pain:  Vitals:   03/17/20 1544  TempSrc:   PainSc: 0-No pain                 Emeril Stille K

## 2020-03-17 NOTE — H&P (Signed)
Outpatient short stay form Pre-procedure 03/17/2020 2:27 PM Darren Perkins K. Alice Reichert, M.D.  Primary Physician: Orland Mustard, M.D.  Reason for visit:  Colon cancer screening  History of present illness:  Patient presents for colonoscopy for colon cancer screening. The patient denies complaints of abdominal pain, significant change in bowel habits, or rectal bleeding.      Current Facility-Administered Medications:  .  0.9 %  sodium chloride infusion, , Intravenous, Continuous, [place], Benay Pike, MD, Last Rate: 20 mL/hr at 03/17/20 1409, 1,000 mL at 03/17/20 1409  Medications Prior to Admission  Medication Sig Dispense Refill Last Dose  . amLODipine (NORVASC) 5 MG tablet Take 1 tablet (5 mg total) by mouth daily. 90 tablet 3 03/17/2020 at Unknown time  . Cholecalciferol 1.25 MG (50000 UT) capsule Take 1 capsule (50,000 Units total) by mouth once a week. 13 capsule 1 Past Week at Unknown time  . losartan-hydrochlorothiazide (HYZAAR) 100-25 MG tablet Take 1 tablet by mouth daily. In am 90 tablet 3 03/17/2020 at Unknown time  . phentermine (ADIPEX-P) 37.5 MG tablet Take 1 tablet (37.5 mg total) by mouth daily before breakfast. (Patient not taking: Reported on 10/30/2019) 60 tablet 0 Not Taking at Unknown time  . sildenafil (REVATIO) 20 MG tablet Take 1-5 tablets (20-100 mg total) by mouth as needed. 30 min to 4 hrs before sex (Patient not taking: Reported on 03/17/2020) 10 tablet 11 Not Taking at Unknown time     No Known Allergies   Past Medical History:  Diagnosis Date  . Hypertension     Review of systems:  Otherwise negative.    Physical Exam  Gen: Alert, oriented. Appears stated age.  HEENT: [place]/AT. PERRLA. Lungs: CTA, no wheezes. CV: RR nl S1, S2. Abd: soft, benign, no masses. BS+ Ext: No edema. Pulses 2+    Planned procedures: Proceed with colonoscopy. The patient understands the nature of the planned procedure, indications, risks, alternatives and potential  complications including but not limited to bleeding, infection, perforation, damage to internal organs and possible oversedation/side effects from anesthesia. The patient agrees and gives consent to proceed.  Please refer to procedure notes for findings, recommendations and patient disposition/instructions.     Ketra Duchesne K. Alice Reichert, M.D. Gastroenterology 03/17/2020  2:27 PM

## 2020-03-17 NOTE — Interval H&P Note (Signed)
History and Physical Interval Note:  03/17/2020 2:27 PM  Darren Perkins  has presented today for surgery, with the diagnosis of COLON CANCER SCREENING.  The various methods of treatment have been discussed with the patient and family. After consideration of risks, benefits and other options for treatment, the patient has consented to  Procedure(s): COLONOSCOPY WITH PROPOFOL (N/A) as a surgical intervention.  The patient's history has been reviewed, patient examined, no change in status, stable for surgery.  I have reviewed the patient's chart and labs.  Questions were answered to the patient's satisfaction.     [place], [place]

## 2020-03-17 NOTE — Op Note (Signed)
[facility] Gastroenterology Patient Name: Darren Perkins Procedure Date: 03/17/2020 3:09 PM MRN: [id number] Account #: [id number] Date of Birth: 01/04/1971 Admit Type: Outpatient Age: 49 Room: [facility] ENDO ROOM 3 Gender: Male Note Status: Finalized Procedure:             Colonoscopy Indications:           Screening for colorectal malignant neoplasm Providers:             Benay Pike. Alice Reichert MD, MD Referring MD:          Nino Glow Mclean-Scocuzza MD, MD (Referring MD) Medicines:             Propofol per Anesthesia Complications:         No immediate complications. Procedure:             Pre-Anesthesia Assessment:                        - The risks and benefits of the procedure and the                         sedation options and risks were discussed with the                         patient. All questions were answered and informed                         consent was obtained.                        - Patient identification and proposed procedure were                         verified prior to the procedure by the nurse. The                         procedure was verified in the procedure room.                        - ASA Grade Assessment: III - A patient with severe                         systemic disease.                        - After reviewing the risks and benefits, the patient                         was deemed in satisfactory condition to undergo the                         procedure.                        After obtaining informed consent, the colonoscope was                         passed under direct vision. Throughout the procedure,                         the patient's blood  pressure, pulse, and oxygen                         saturations were monitored continuously. The                         Colonoscope was introduced through the anus and                         advanced to the the cecum, identified by appendiceal                         orifice and  ileocecal valve. The colonoscopy was                         performed without difficulty. The patient tolerated                         the procedure well. The quality of the bowel                         preparation was adequate. The ileocecal valve,                         appendiceal orifice, and rectum were photographed. Findings:      The perianal and digital rectal examinations were normal. Pertinent       negatives include normal sphincter tone and no palpable rectal lesions.      Multiple small and large-mouthed diverticula were found in the sigmoid       colon.      Two sessile polyps were found in the splenic flexure and transverse       colon. The polyps were 4 to 5 mm in size. These polyps were removed with       a jumbo cold forceps. Resection and retrieval were complete.      Non-bleeding internal hemorrhoids were found during retroflexion. The       hemorrhoids were Grade I (internal hemorrhoids that do not prolapse).      The exam was otherwise without abnormality. Impression:            - Diverticulosis in the sigmoid colon.                        - Two 4 to 5 mm polyps at the splenic flexure and in                         the transverse colon, removed with a jumbo cold                         forceps. Resected and retrieved.                        - Non-bleeding internal hemorrhoids.                        - The examination was otherwise normal. Recommendation:        - Patient has a contact number available for  emergencies. The signs and symptoms of potential                         delayed complications were discussed with the patient.                         Return to normal activities tomorrow. Written                         discharge instructions were provided to the patient.                        - Resume previous diet.                        - Continue present medications.                        - Repeat colonoscopy is recommended for  surveillance.                         The colonoscopy date will be determined after                         pathology results from today's exam become available                         for review.                        - Return to GI office PRN. Procedure Code(s):     --- Professional ---                        [phone], Colonoscopy, flexible; with biopsy, single or                         multiple Diagnosis Code(s):     --- Professional ---                        K57.30, Diverticulosis of large intestine without                         perforation or abscess without bleeding                        K63.5, Polyp of colon                        K64.0, First degree hemorrhoids                        Z12.11, Encounter for screening for malignant neoplasm                         of colon CPT copyright 2019 American Medical Association. All rights reserved. The codes documented in this report are preliminary and upon coder review may  be revised to meet current compliance requirements. Efrain Sella MD, MD 03/17/2020 3:34:00 PM This report has been signed electronically. Number of Addenda: 0 Note Initiated On: 03/17/2020 3:09 PM Scope Withdrawal Time: 0  hours 7 minutes 54 seconds  Total Procedure Duration: 0 hours 12 minutes 17 seconds  Estimated Blood Loss:  Estimated blood loss: none.      [facility]

## 2020-03-17 NOTE — Transfer of Care (Signed)
Immediate Anesthesia Transfer of Care Note  Patient: Darren Perkins  Procedure(s) Performed: COLONOSCOPY WITH PROPOFOL (N/A )  Patient Location: Endoscopy Unit  Anesthesia Type:General  Level of Consciousness: drowsy, patient cooperative and responds to stimulation  Airway & Oxygen Therapy: Patient Spontanous Breathing and Patient connected to face mask oxygen  Post-op Assessment: Report given to RN and Post -op Vital signs reviewed and stable  Post vital signs: Reviewed and stable  Last Vitals:  Vitals Value Taken Time  BP 158/96 03/17/20 1535  Temp 36.1 C 03/17/20 1534  Pulse 96 03/17/20 1536  Resp 25 03/17/20 1536  SpO2 97 % 03/17/20 1536  Vitals shown include unvalidated device data.  Last Pain:  Vitals:   03/17/20 1534  TempSrc: Tympanic  PainSc: Asleep         Complications: No complications documented.

## 2020-03-17 NOTE — Anesthesia Preprocedure Evaluation (Signed)
Anesthesia Evaluation  Patient identified by MRN, date of birth, ID band Patient awake    Reviewed: Allergy & Precautions, H&P , NPO status , Patient's Chart, lab work & pertinent test results, reviewed documented beta blocker date and time   History of Anesthesia Complications Negative for: history of anesthetic complications  Airway Mallampati: III  TM Distance: >3 FB Neck ROM: full    Dental  (+) Dental Advidsory Given, Missing, Partial Lower   Pulmonary neg pulmonary ROS,    Pulmonary exam normal breath sounds clear to auscultation       Cardiovascular Exercise Tolerance: Good hypertension, (-) angina(-) Past MI and (-) Cardiac Stents Normal cardiovascular exam(-) dysrhythmias (-) Valvular Problems/Murmurs Rhythm:regular Rate:Normal     Neuro/Psych PSYCHIATRIC DISORDERS Anxiety Depression negative neurological ROS     GI/Hepatic negative GI ROS, Neg liver ROS,   Endo/Other  neg diabetesMorbid obesity  Renal/GU negative Renal ROS  negative genitourinary   Musculoskeletal   Abdominal   Peds  Hematology negative hematology ROS (+)   Anesthesia Other Findings Past Medical History: No date: Hypertension   Reproductive/Obstetrics negative OB ROS                             Anesthesia Physical Anesthesia Plan  ASA: III  Anesthesia Plan: General   Post-op Pain Management:    Induction: Intravenous  PONV Risk Score and Plan: 2 and Propofol infusion and TIVA  Airway Management Planned: Natural Airway and Nasal Cannula  Additional Equipment:   Intra-op Plan:   Post-operative Plan:   Informed Consent: I have reviewed the patients History and Physical, chart, labs and discussed the procedure including the risks, benefits and alternatives for the proposed anesthesia with the patient or authorized representative who has indicated his/her understanding and acceptance.     Dental  Advisory Given  Plan Discussed with: Anesthesiologist, CRNA and Surgeon  Anesthesia Plan Comments:         Anesthesia Quick Evaluation

## 2020-03-18 ENCOUNTER — Encounter: Payer: Self-pay | Admitting: Internal Medicine

## 2020-03-19 LAB — SURGICAL PATHOLOGY

## 2020-03-30 ENCOUNTER — Encounter: Payer: Self-pay | Admitting: Internal Medicine

## 2020-04-11 ENCOUNTER — Encounter: Payer: Self-pay | Admitting: Internal Medicine

## 2020-08-13 ENCOUNTER — Encounter: Payer: Self-pay | Admitting: Internal Medicine

## 2020-08-13 ENCOUNTER — Ambulatory Visit (INDEPENDENT_AMBULATORY_CARE_PROVIDER_SITE_OTHER): Payer: BC Managed Care – PPO | Admitting: Internal Medicine

## 2020-08-13 ENCOUNTER — Other Ambulatory Visit: Payer: Self-pay

## 2020-08-13 VITALS — BP 150/90 | HR 73 | Temp 98.5°F | Ht 69.09 in | Wt 298.6 lb

## 2020-08-13 DIAGNOSIS — M1712 Unilateral primary osteoarthritis, left knee: Secondary | ICD-10-CM | POA: Insufficient documentation

## 2020-08-13 DIAGNOSIS — E611 Iron deficiency: Secondary | ICD-10-CM

## 2020-08-13 DIAGNOSIS — E559 Vitamin D deficiency, unspecified: Secondary | ICD-10-CM | POA: Diagnosis not present

## 2020-08-13 DIAGNOSIS — Z6841 Body Mass Index (BMI) 40.0 and over, adult: Secondary | ICD-10-CM

## 2020-08-13 DIAGNOSIS — F32A Depression, unspecified: Secondary | ICD-10-CM

## 2020-08-13 DIAGNOSIS — Z13818 Encounter for screening for other digestive system disorders: Secondary | ICD-10-CM | POA: Diagnosis not present

## 2020-08-13 DIAGNOSIS — R7303 Prediabetes: Secondary | ICD-10-CM

## 2020-08-13 DIAGNOSIS — F419 Anxiety disorder, unspecified: Secondary | ICD-10-CM

## 2020-08-13 DIAGNOSIS — N529 Male erectile dysfunction, unspecified: Secondary | ICD-10-CM

## 2020-08-13 DIAGNOSIS — Z23 Encounter for immunization: Secondary | ICD-10-CM | POA: Diagnosis not present

## 2020-08-13 DIAGNOSIS — Z0000 Encounter for general adult medical examination without abnormal findings: Secondary | ICD-10-CM | POA: Diagnosis not present

## 2020-08-13 DIAGNOSIS — I10 Essential (primary) hypertension: Secondary | ICD-10-CM | POA: Diagnosis not present

## 2020-08-13 LAB — COMPREHENSIVE METABOLIC PANEL
ALT: 21 U/L (ref 0–53)
AST: 19 U/L (ref 0–37)
Albumin: 4.6 g/dL (ref 3.5–5.2)
Alkaline Phosphatase: 70 U/L (ref 39–117)
BUN: 12 mg/dL (ref 6–23)
CO2: 28 mEq/L (ref 19–32)
Calcium: 9.7 mg/dL (ref 8.4–10.5)
Chloride: 102 mEq/L (ref 96–112)
Creatinine, Ser: 1.01 mg/dL (ref 0.40–1.50)
GFR: 86.08 mL/min (ref >60.00)
Glucose, Bld: 82 mg/dL (ref 70–99)
Potassium: 3.9 mEq/L (ref 3.5–5.1)
Sodium: 139 mEq/L (ref 135–145)
Total Bilirubin: 0.6 mg/dL (ref 0.2–1.2)
Total Protein: 7.2 g/dL (ref 6.0–8.3)

## 2020-08-13 LAB — CBC WITH DIFFERENTIAL/PLATELET
Basophils Absolute: 0.1 10*3/uL (ref 0.0–0.1)
Basophils Relative: 1.1 % (ref 0.0–3.0)
Eosinophils Absolute: 0.3 10*3/uL (ref 0.0–0.7)
Eosinophils Relative: 2.9 % (ref 0.0–5.0)
HCT: 42.5 % (ref 39.0–52.0)
Hemoglobin: 13.9 g/dL (ref 13.0–17.0)
Lymphocytes Relative: 16.6 % (ref 12.0–46.0)
Lymphs Abs: 1.8 10*3/uL (ref 0.7–4.0)
MCHC: 32.6 g/dL (ref 30.0–36.0)
MCV: 77.5 fl — ABNORMAL LOW (ref 78.0–100.0)
Monocytes Absolute: 1 10*3/uL (ref 0.1–1.0)
Monocytes Relative: 8.9 % (ref 3.0–12.0)
Neutro Abs: 7.7 10*3/uL (ref 1.4–7.7)
Neutrophils Relative %: 70.5 % (ref 43.0–77.0)
Platelets: 294 10*3/uL (ref 150.0–400.0)
RBC: 5.48 Mil/uL (ref 4.22–5.81)
RDW: 16.3 % — ABNORMAL HIGH (ref 11.5–15.5)
WBC: 10.9 10*3/uL — ABNORMAL HIGH (ref 4.0–10.5)

## 2020-08-13 LAB — LIPID PANEL
Cholesterol: 151 mg/dL (ref 0–200)
HDL: 53.9 mg/dL (ref >39.00)
LDL Cholesterol: 83 mg/dL (ref 0–99)
NonHDL: 96.77
Total CHOL/HDL Ratio: 3
Triglycerides: 67 mg/dL (ref 0.0–149.0)
VLDL: 13.4 mg/dL (ref 0.0–40.0)

## 2020-08-13 LAB — VITAMIN D 25 HYDROXY (VIT D DEFICIENCY, FRACTURES): VITD: 28.14 ng/mL — ABNORMAL LOW (ref 30.00–100.00)

## 2020-08-13 LAB — HEMOGLOBIN A1C: Hgb A1c MFr Bld: 6.5 % (ref 4.6–6.5)

## 2020-08-13 MED ORDER — SILDENAFIL CITRATE 20 MG PO TABS: 20.0000 mg | ORAL_TABLET | ORAL | 11 refills | Status: DC | PRN

## 2020-08-13 MED ORDER — LOSARTAN POTASSIUM-HCTZ 100-25 MG PO TABS: 1.0000 | ORAL_TABLET | Freq: Every day | ORAL | 3 refills | Status: DC

## 2020-08-13 MED ORDER — AMLODIPINE BESYLATE 10 MG PO TABS: 10.0000 mg | ORAL_TABLET | Freq: Every day | ORAL | 3 refills | Status: DC

## 2020-08-13 NOTE — Patient Instructions (Addendum)
Goal <130/<80   glucosamine/chondroitin, turmeric, cortisone injections as well as viscosupplementation. Call if steroid shot needed   Tylenol as needed  Voltaren gel 4x per day   11/14/2019 Initial consult [facility]  [place], [place] [address]  [phone]  Lauris Poag, [place]  [facility] [place], [place] [address]  [phone]  [phone] ([address])    Feliberto Gottron, [address] [place]  [place], [place] [address]  [phone]  [phone] (Fax)  Primary osteoarthritis of left knee (Primary Dx)  Social History - documented as of this encounter Tobacco Use Types Packs/Day Years Used Date           DASH Eating Plan DASH stands for "Dietary Approaches to Stop Hypertension." The DASH eating plan is a healthy eating plan that has been shown to reduce high blood pressure (hypertension). It may also reduce your risk for type 2 diabetes, heart disease, and stroke. The DASH eating plan may also help with weight loss. What are tips for following this plan?  General guidelines  Avoid eating more than 2,300 mg (milligrams) of salt (sodium) a day. If you have hypertension, you may need to reduce your sodium intake to 1,500 mg a day.  Limit alcohol intake to no more than 1 drink a day for nonpregnant women and 2 drinks a day for men. One drink equals 12 oz of beer, 5 oz of wine, or 1 oz of hard liquor.  Work with your health care provider to maintain a healthy body weight or to lose weight. Ask what an ideal weight is for you.  Get at least 30 minutes of exercise that causes your heart to beat faster (aerobic exercise) most days of the week. Activities may include walking, swimming, or biking.  Work with your health care provider or diet and nutrition specialist (dietitian) to adjust your eating plan to your individual calorie needs. Reading food labels   Check food labels for the  amount of sodium per serving. Choose foods with less than 5 percent of the Daily Value of sodium. Generally, foods with less than 300 mg of sodium per serving fit into this eating plan.  To find whole grains, look for the word "whole" as the first word in the ingredient list. Shopping  Buy products labeled as "low-sodium" or "no salt added."  Buy fresh foods. Avoid canned foods and premade or frozen meals. Cooking  Avoid adding salt when cooking. Use salt-free seasonings or herbs instead of table salt or sea salt. Check with your health care provider or pharmacist before using salt substitutes.  Do not fry foods. Cook foods using healthy methods such as baking, boiling, grilling, and broiling instead.  Cook with heart-healthy oils, such as olive, canola, soybean, or sunflower oil. Meal planning  Eat a balanced diet that includes: ? 5 or more servings of fruits and vegetables each day. At each meal, try to fill half of your plate with fruits and vegetables. ? Up to 6-8 servings of whole grains each day. ? Less than 6 oz of lean meat, poultry, or fish each day. A 3-oz serving of meat is about the same size as a deck of cards. One egg equals 1 oz. ? 2 servings of low-fat dairy each day. ? A serving of nuts, seeds, or beans 5 times each week. ? Heart-healthy fats. Healthy fats called Omega-3 fatty acids are found in foods such as flaxseeds and coldwater fish, like  sardines, salmon, and mackerel.  Limit how much you eat of the following: ? Canned or prepackaged foods. ? Food that is high in trans fat, such as fried foods. ? Food that is high in saturated fat, such as fatty meat. ? Sweets, desserts, sugary drinks, and other foods with added sugar. ? Full-fat dairy products.  Do not salt foods before eating.  Try to eat at least 2 vegetarian meals each week.  Eat more home-cooked food and less restaurant, buffet, and fast food.  When eating at a restaurant, ask that your food be  prepared with less salt or no salt, if possible. What foods are recommended? The items listed may not be a complete list. Talk with your dietitian about what dietary choices are best for you. Grains Whole-grain or whole-wheat bread. Whole-grain or whole-wheat pasta. Brown rice. [place]. Bulgur. Whole-grain and low-sodium cereals. Pita bread. Low-fat, low-sodium crackers. Whole-wheat flour tortillas. Vegetables Fresh or frozen vegetables (raw, steamed, roasted, or grilled). Low-sodium or reduced-sodium tomato and vegetable juice. Low-sodium or reduced-sodium tomato sauce and tomato paste. Low-sodium or reduced-sodium canned vegetables. Fruits All fresh, dried, or frozen fruit. Canned fruit in natural juice (without added sugar). Meat and other protein foods Skinless chicken or [place]. Ground chicken or [place]. Pork with fat trimmed off. Fish and seafood. Egg whites. Dried beans, peas, or lentils. Unsalted nuts, nut butters, and seeds. Unsalted canned beans. Lean cuts of beef with fat trimmed off. Low-sodium, lean deli meat. Dairy Low-fat (1%) or fat-free (skim) milk. Fat-free, low-fat, or reduced-fat cheeses. Nonfat, low-sodium ricotta or cottage cheese. Low-fat or nonfat yogurt. Low-fat, low-sodium cheese. Fats and oils Soft margarine without trans fats. Vegetable oil. Low-fat, reduced-fat, or light mayonnaise and salad dressings (reduced-sodium). Canola, safflower, olive, soybean, and sunflower oils. Avocado. Seasoning and other foods Herbs. Spices. Seasoning mixes without salt. Unsalted popcorn and pretzels. Fat-free sweets. What foods are not recommended? The items listed may not be a complete list. Talk with your dietitian about what dietary choices are best for you. Grains Baked goods made with fat, such as croissants, muffins, or some breads. Dry pasta or rice meal packs. Vegetables Creamed or fried vegetables. Vegetables in a cheese sauce. Regular canned vegetables (not  low-sodium or reduced-sodium). Regular canned tomato sauce and paste (not low-sodium or reduced-sodium). Regular tomato and vegetable juice (not low-sodium or reduced-sodium). Angie Fava. Olives. Fruits Canned fruit in a light or heavy syrup. Fried fruit. Fruit in cream or butter sauce. Meat and other protein foods Fatty cuts of meat. Ribs. Fried meat. Berniece Salines. Sausage. Bologna and other processed lunch meats. Salami. Fatback. Hotdogs. Bratwurst. Salted nuts and seeds. Canned beans with added salt. Canned or smoked fish. Whole eggs or egg yolks. Chicken or [place] with skin. Dairy Whole or 2% milk, cream, and half-and-half. Whole or full-fat cream cheese. Whole-fat or sweetened yogurt. Full-fat cheese. Nondairy creamers. Whipped toppings. Processed cheese and cheese spreads. Fats and oils Butter. Stick margarine. Lard. Shortening. Ghee. Bacon fat. Tropical oils, such as coconut, palm kernel, or palm oil. Seasoning and other foods Salted popcorn and pretzels. Onion salt, garlic salt, seasoned salt, table salt, and sea salt. Worcestershire sauce. Tartar sauce. Barbecue sauce. Teriyaki sauce. Soy sauce, including reduced-sodium. Steak sauce. Canned and packaged gravies. Fish sauce. Oyster sauce. Cocktail sauce. Horseradish that you find on the shelf. Ketchup. Mustard. Meat flavorings and tenderizers. Bouillon cubes. Hot sauce and Tabasco sauce. Premade or packaged marinades. Premade or packaged taco seasonings. Relishes. Regular salad dressings. Where to find more information:  National Heart, Lung, and Blood Institute: https://wilson-eaton.com/  American Heart Association: www.heart.org Summary  The DASH eating plan is a healthy eating plan that has been shown to reduce high blood pressure (hypertension). It may also reduce your risk for type 2 diabetes, heart disease, and stroke.  With the DASH eating plan, you should limit salt (sodium) intake to 2,300 mg a day. If you have hypertension, you may need to reduce  your sodium intake to 1,500 mg a day.  When on the DASH eating plan, aim to eat more fresh fruits and vegetables, whole grains, lean proteins, low-fat dairy, and heart-healthy fats.  Work with your health care provider or diet and nutrition specialist (dietitian) to adjust your eating plan to your individual calorie needs. This information is not intended to replace advice given to you by your health care provider. Make sure you discuss any questions you have with your health care provider. Document Revised: 08/05/2017 Document Reviewed: 08/16/2016 Elsevier Patient Education  2020 [employer].

## 2020-08-13 NOTE — Progress Notes (Signed)
Chief Complaint  Patient presents with  . Annual Exam  . Immunizations   Annual  1. HTN elevated today he does eat a lot of salt on norvasc 5 mg qd and losartan hctz 100-25 will do labs today  2. H/o anxiety and depression wants to go back to [facility] group therapy Larae Grooms helped in the past  3. Left knee pain at times mild to moderate and wears a knee brace est with [facility] seen 11/2019 but did not have steroid injection    Review of Systems  Constitutional: Negative for weight loss.  HENT: Negative for hearing loss.   Eyes: Negative for blurred vision.  Respiratory: Negative for shortness of breath.   Cardiovascular: Negative for chest pain.  Gastrointestinal: Negative for abdominal pain.  Skin: Negative for rash.  Neurological: Negative for headaches.  Psychiatric/Behavioral: Negative for depression.   Past Medical History:  Diagnosis Date  . Hypertension    Past Surgical History:  Procedure Laterality Date  . COLONOSCOPY WITH PROPOFOL N/A 03/17/2020   Procedure: COLONOSCOPY WITH PROPOFOL;  Surgeon: Toledo, Benay Pike, MD;  Location: [facility] ENDOSCOPY;  Service: Gastroenterology;  Laterality: N/A;  . LAPAROSCOPIC GASTRIC BANDING     Dr. Flavia Shipper [place] 2008    Family History  Problem Relation Age of Onset  . Arthritis Mother   . Hypertension Mother   . Alcohol abuse Father   . Diabetes Father   . Hypertension Brother   . Hypertension Brother   . Bipolar disorder Daughter    Social History   Socioeconomic History  . Marital status: Married    Spouse name: Not on file  . Number of children: Not on file  . Years of education: Not on file  . Highest education level: Not on file  Occupational History  . Not on file  Tobacco Use  . Smoking status: Never Smoker  . Smokeless tobacco: Never Used  Vaping Use  . Vaping Use: Never used  Substance and Sexual Activity  . Alcohol use: Not Currently  . Drug use: Not Currently  . Sexual activity: Yes    Comment:  wife   Other Topics Concern  . 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 Difficulty of Paying Living Expenses: Not on file  Food Insecurity:   . Worried About Charity fundraiser in the Last Year: Not on file  . Ran Out of Food in the Last Year: Not on file  Transportation Needs:   . Lack of Transportation (Medical): Not on file  . Lack of Transportation (Non-Medical): Not on file  Physical Activity:   . Days of Exercise per Week: Not on file  . Minutes of Exercise per Session: Not on file  Stress:   . Feeling of Stress : Not on file  Social Connections:   . Frequency of Communication with Friends and Family: Not on file  . Frequency of Social Gatherings with Friends and Family: Not on file  . Attends Religious Services: Not on file  . Active Member of Clubs or Organizations: Not on file  . Attends Archivist Meetings: Not on file  . Marital Status: Not on file  Intimate Partner Violence:   . Fear  of Current or Ex-Partner: Not on file  . Emotionally Abused: Not on file  . Physically Abused: Not on file  . Sexually Abused: Not on file   Current Meds  Medication Sig  . amLODipine (NORVASC) 10 MG tablet Take 1 tablet (10 mg total) by mouth daily.  . Cholecalciferol 1.25 MG (50000 UT) capsule Take 1 capsule (50,000 Units total) by mouth once a week.  . losartan-hydrochlorothiazide (HYZAAR) 100-25 MG tablet Take 1 tablet by mouth daily. In am  . [DISCONTINUED] amLODipine (NORVASC) 5 MG tablet Take 1 tablet (5 mg total) by mouth daily.   No Known Allergies No results found for this or any previous visit (from the past 2160 hour(s)). Objective  Body mass index is 43.98 kg/m. Wt Readings from Last 3 Encounters:  08/13/20 298 lb 9.6 oz  (135.4 kg)  03/17/20 295 lb (133.8 kg)  10/30/19 298 lb (135.2 kg)   Temp Readings from Last 3 Encounters:  08/13/20 98.5 F (36.9 C) (Oral)  03/17/20 (!) 97 F (36.1 C) (Tympanic)  10/03/18 98.4 F (36.9 C) (Oral)   BP Readings from Last 3 Encounters:  08/13/20 (!) 150/90  03/17/20 114/73  10/03/18 138/66   Pulse Readings from Last 3 Encounters:  08/13/20 73  03/17/20 78  10/03/18 98    Physical Exam Vitals and nursing note reviewed.  Constitutional:      Appearance: Normal appearance. He is well-developed and well-groomed. He is morbidly obese.  HENT:     Head: Normocephalic and atraumatic.  Eyes:     Conjunctiva/sclera: Conjunctivae normal.     Pupils: Pupils are equal, round, and reactive to light.  Cardiovascular:     Rate and Rhythm: Normal rate and regular rhythm.     Heart sounds: Normal heart sounds. No murmur heard.   Pulmonary:     Effort: Pulmonary effort is normal.     Breath sounds: Normal breath sounds.  Skin:    General: Skin is warm and dry.  Neurological:     General: No focal deficit present.     Mental Status: He is alert and oriented to person, place, and time. Mental status is at baseline.     Gait: Gait normal.  Psychiatric:        Attention and Perception: Attention and perception normal.        Mood and Affect: Mood and affect normal.        Speech: Speech normal.        Behavior: Behavior normal. Behavior is cooperative.        Thought Content: Thought content normal.        Cognition and Memory: Cognition and memory normal.        Judgment: Judgment normal.     Assessment  Plan  Annual physical exam Needs flu shot - Plan: Flu Vaccine QUAD 6+ mos PF IM (Fluarix Quad PF) Flu shotgiven today  covid 3/3 Tdap pt thinks he has had will check [facility].Labs unable to due to do no insurance fasting labs today   Colonoscopy 03/17/2020 [facility] tubular FH in 5 years x 2 polyps   PSA 0.22 rec healthy diet and exercise and salt reduction   Hep C check today declines HIV   Primary hypertension - Plan: Comprehensive metabolic panel, Lipid panel, CBC with Differential/Platelet, amLODipine (NORVASC) 10 MG tablet increase cont hyzaar 100-25 salt reduction  Prediabetes - Plan: Hemoglobin A1c  Iron deficiency - Plan: CBC with Differential/Platelet, Iron, TIBC and Ferritin Panel  Vitamin D  deficiency - Plan: Vitamin D (25 hydroxy)  Anxiety and depression - Plan: Ambulatory referral to Psychology shonna mcintosh carruth   Morbid obesity with BMI of 40.0-44.9, adult ([place])  rec healthy diet and exercise   Left knee arthritis  Cont brace and f/u [facility]  Provider: Dr. Olivia Mackie McLean-Scocuzza-Internal Medicine

## 2020-08-14 LAB — IRON,TIBC AND FERRITIN PANEL
%SAT: 12 % (calc) — ABNORMAL LOW (ref 20–48)
Ferritin: 61 ng/mL (ref 38–380)
Iron: 44 ug/dL — ABNORMAL LOW (ref 50–180)
TIBC: 362 mcg/dL (calc) (ref 250–425)

## 2020-08-14 LAB — HEPATITIS C ANTIBODY
Hepatitis C Ab: NONREACTIVE
SIGNAL TO CUT-OFF: 0.01 (ref <1.00)

## 2020-10-27 DIAGNOSIS — F411 Generalized anxiety disorder: Secondary | ICD-10-CM | POA: Diagnosis not present

## 2020-11-06 ENCOUNTER — Other Ambulatory Visit: Payer: Self-pay | Admitting: Internal Medicine

## 2020-11-06 ENCOUNTER — Telehealth: Payer: Self-pay | Admitting: Internal Medicine

## 2020-11-06 DIAGNOSIS — I10 Essential (primary) hypertension: Secondary | ICD-10-CM

## 2020-11-06 MED ORDER — LOSARTAN POTASSIUM-HCTZ 100-25 MG PO TABS: 1.0000 | ORAL_TABLET | Freq: Every day | ORAL | 3 refills | Status: DC

## 2020-11-06 NOTE — Telephone Encounter (Signed)
Call pharmacy [facility] for refills always

## 2020-11-06 NOTE — Telephone Encounter (Signed)
Patient is requesting a refill on his losartan-hydrochlorothiazide (HYZAAR) 100-25 MG tablet

## 2020-12-12 ENCOUNTER — Ambulatory Visit
Admission: RE | Admit: 2020-12-12 | Discharge: 2020-12-12 | Disposition: A | Discharge status: Home / Self Care | Payer: BC Managed Care – PPO | Source: Ambulatory Visit | Attending: Internal Medicine | Admitting: Internal Medicine

## 2020-12-12 ENCOUNTER — Ambulatory Visit (INDEPENDENT_AMBULATORY_CARE_PROVIDER_SITE_OTHER): Payer: BC Managed Care – PPO | Admitting: Internal Medicine

## 2020-12-12 ENCOUNTER — Ambulatory Visit
Admission: RE | Admit: 2020-12-12 | Discharge: 2020-12-12 | Disposition: A | Discharge status: Home / Self Care | Payer: BC Managed Care – PPO | Source: Home / Self Care | Attending: Internal Medicine | Admitting: Internal Medicine

## 2020-12-12 ENCOUNTER — Other Ambulatory Visit: Payer: Self-pay

## 2020-12-12 ENCOUNTER — Telehealth: Payer: Self-pay

## 2020-12-12 ENCOUNTER — Encounter: Payer: Self-pay | Admitting: Internal Medicine

## 2020-12-12 VITALS — BP 140/90 | HR 76 | Temp 98.1°F | Ht 69.09 in | Wt 304.0 lb

## 2020-12-12 DIAGNOSIS — M25562 Pain in left knee: Secondary | ICD-10-CM | POA: Insufficient documentation

## 2020-12-12 DIAGNOSIS — M79605 Pain in left leg: Secondary | ICD-10-CM | POA: Diagnosis not present

## 2020-12-12 DIAGNOSIS — M79662 Pain in left lower leg: Secondary | ICD-10-CM | POA: Diagnosis not present

## 2020-12-12 DIAGNOSIS — M25462 Effusion, left knee: Secondary | ICD-10-CM

## 2020-12-12 DIAGNOSIS — M1712 Unilateral primary osteoarthritis, left knee: Secondary | ICD-10-CM

## 2020-12-12 NOTE — Patient Instructions (Signed)
coppertone compression socks [facility]  Heat/ice  voltaren gel

## 2020-12-12 NOTE — Telephone Encounter (Signed)
[place] normal no blood clot  Pending Xrays left knee and shin bone

## 2020-12-12 NOTE — Progress Notes (Addendum)
Chief Complaint  Patient presents with  . Follow-up  . Leg Pain    Left frontal calf pain. Ongoing off and on for 3 years. Increased pain yesterday, rated 10/10. Woke up this morning with no pain. No known injury.    F/u with wife  1. Left lower leg/shin pain yesterday 10/10 intermittent x 3-4 years and h/o left knee pain and pain in shin worse x 3-4 years no swelling wife c/w blood clot nothing tried    Review of Systems  Constitutional: Negative for weight loss.  HENT: Negative for hearing loss.   Eyes: Negative for blurred vision.  Respiratory: Negative for shortness of breath.   Cardiovascular: Negative for chest pain.  Gastrointestinal: Negative for abdominal pain.  Musculoskeletal: Positive for joint pain.  Skin: Negative for rash.  Neurological: Negative for headaches.  Psychiatric/Behavioral: Negative for depression.   Past Medical History:  Diagnosis Date  . Hypertension    Past Surgical History:  Procedure Laterality Date  . COLONOSCOPY WITH PROPOFOL N/A 03/17/2020   Procedure: COLONOSCOPY WITH PROPOFOL;  Surgeon: Toledo, Benay Pike, MD;  Location: [facility] ENDOSCOPY;  Service: Gastroenterology;  Laterality: N/A;  . LAPAROSCOPIC GASTRIC BANDING     Dr. Flavia Shipper [place] 2008    Family History  Problem Relation Age of Onset  . Arthritis Mother   . Hypertension Mother   . Alcohol abuse Father   . Diabetes Father   . Hypertension Brother   . Hypertension Brother   . Bipolar disorder Daughter    Social History   Socioeconomic History  . Marital status: Married    Spouse name: Not on file  . Number of children: Not on file  . Years of education: Not on file  . Highest education level: Not on file  Occupational History  . Not on file  Tobacco Use  . Smoking status: Never Smoker  . Smokeless tobacco: Never Used  Vaping Use  . Vaping Use: Never used  Substance and Sexual Activity  . Alcohol use: Not Currently  . Drug use: Not Currently  . Sexual activity: Yes     Comment: wife   Other Topics Concern  . 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 amLODipine (NORVASC) 10 MG tablet Take 1 tablet (10 mg total) by mouth daily.  . Cholecalciferol 1.25 MG (50000 UT) capsule Take 1 capsule (50,000 Units total) by mouth once a week.  . losartan-hydrochlorothiazide (HYZAAR) 100-25 MG tablet Take 1 tablet by mouth daily. In am   No Known Allergies No results found for this or any previous visit (from the past 2160 hour(s)). Objective  Body mass index is 44.78 kg/m. Wt Readings from Last 3 Encounters:  12/12/20 (!) 304 lb (137.9 kg)  08/13/20 298 lb 9.6 oz (135.4 kg)  03/17/20 295 lb (133.8 kg)   Temp Readings from Last 3 Encounters:  12/12/20 98.1 F (36.7 C) (Oral)  08/13/20 98.5 F (36.9 C) (Oral)  03/17/20 (!) 97  F (36.1 C) (Tympanic)   BP Readings from Last 3 Encounters:  12/12/20 140/90  08/13/20 (!) 150/90  03/17/20 114/73   Pulse Readings from Last 3 Encounters:  12/12/20 76  08/13/20 73  03/17/20 78    Physical Exam Vitals and nursing note reviewed.  Constitutional:      Appearance: Normal appearance. He is well-developed and well-groomed.  HENT:     Head: Normocephalic and atraumatic.  Cardiovascular:     Rate and Rhythm: Normal rate and regular rhythm.     Heart sounds: Normal heart sounds. No murmur heard.   Pulmonary:     Effort: Pulmonary effort is normal.     Breath sounds: Normal breath sounds.  Musculoskeletal:       Legs:  Skin:    General:  Skin is warm and dry.  Neurological:     General: No focal deficit present.     Mental Status: He is alert and oriented to person, place, and time. Mental status is at baseline.     Gait: Gait normal.  Psychiatric:        Attention and Perception: Attention and perception normal.        Mood and Affect: Mood and affect normal.        Speech: Speech normal.        Behavior: Behavior normal. Behavior is cooperative.        Thought Content: Thought content normal.        Cognition and Memory: Cognition and memory normal.        Judgment: Judgment normal.     Assessment  Plan  Left leg pain  R/o DVT - Plan: US Venous Img Lower Unilateral Left, DG Tibia/Fibula Left  Acute pain of left knee - Plan: DG Knee Complete 4 Views Left, DG Tibia/Fibula Left  Pain in left shin - Plan: DG Tibia/Fibula Left   coppertone compression socks [facility]  Heat/ice  voltaren gel  Tylenol   HM Flu shotgutd covid 3/3 Tdap pt thinks he has had will check [facility].Labs unable to due to do no insurance fasting labs today    Colonoscopy 03/17/2020 [facility] FH in 5 years x 2 polyps  PSA 0.22 11/12/19 due at f/u rec healthy diet and exerciseand salt reduction  Hep C check today declines HIV   Provider: Dr. Olivia Mackie McLean-Scocuzza-Internal Medicine

## 2020-12-12 NOTE — Telephone Encounter (Signed)
Patient informed and verbalized understanding

## 2020-12-12 NOTE — Telephone Encounter (Signed)
Radiology called to state below. FYI  IMPRESSION: No evidence of deep venous thrombosis seen in the left lower extremity.

## 2020-12-16 ENCOUNTER — Other Ambulatory Visit: Payer: Self-pay

## 2020-12-22 DIAGNOSIS — M25462 Effusion, left knee: Secondary | ICD-10-CM | POA: Insufficient documentation

## 2020-12-22 NOTE — Addendum Note (Signed)
Addended by: Orland Mustard on: 12/22/2020 04:05 PM   Modules accepted: Orders

## 2020-12-31 DIAGNOSIS — M75101 Unspecified rotator cuff tear or rupture of right shoulder, not specified as traumatic: Secondary | ICD-10-CM | POA: Diagnosis not present

## 2020-12-31 DIAGNOSIS — M1712 Unilateral primary osteoarthritis, left knee: Secondary | ICD-10-CM | POA: Diagnosis not present

## 2020-12-31 DIAGNOSIS — M25511 Pain in right shoulder: Secondary | ICD-10-CM | POA: Diagnosis not present

## 2021-02-03 ENCOUNTER — Telehealth: Payer: Self-pay | Admitting: Internal Medicine

## 2021-02-03 DIAGNOSIS — I10 Essential (primary) hypertension: Secondary | ICD-10-CM

## 2021-02-03 MED ORDER — LOSARTAN POTASSIUM-HCTZ 100-25 MG PO TABS: 1.0000 | ORAL_TABLET | Freq: Every day | ORAL | 3 refills | Status: DC

## 2021-02-03 NOTE — Telephone Encounter (Signed)
Patient called in for refill for losartan-hydrochlorothiazide (HYZAAR) 100-25 MG tablet

## 2021-04-16 ENCOUNTER — Encounter: Payer: Self-pay | Admitting: Internal Medicine

## 2021-06-17 DIAGNOSIS — M13862 Other specified arthritis, left knee: Secondary | ICD-10-CM | POA: Diagnosis not present

## 2021-06-17 DIAGNOSIS — M25562 Pain in left knee: Secondary | ICD-10-CM | POA: Diagnosis not present

## 2021-06-18 ENCOUNTER — Ambulatory Visit: Payer: BC Managed Care – PPO | Admitting: Internal Medicine

## 2021-07-28 ENCOUNTER — Other Ambulatory Visit: Payer: Self-pay | Admitting: Internal Medicine

## 2021-07-28 DIAGNOSIS — I10 Essential (primary) hypertension: Secondary | ICD-10-CM

## 2021-09-29 DIAGNOSIS — Z20822 Contact with and (suspected) exposure to covid-19: Secondary | ICD-10-CM | POA: Diagnosis not present

## 2021-09-29 DIAGNOSIS — Z03818 Encounter for observation for suspected exposure to other biological agents ruled out: Secondary | ICD-10-CM | POA: Diagnosis not present

## 2021-09-30 ENCOUNTER — Other Ambulatory Visit: Payer: Self-pay

## 2021-09-30 ENCOUNTER — Encounter: Payer: Self-pay | Admitting: Internal Medicine

## 2021-09-30 ENCOUNTER — Telehealth (INDEPENDENT_AMBULATORY_CARE_PROVIDER_SITE_OTHER): Payer: BC Managed Care – PPO | Admitting: Internal Medicine

## 2021-09-30 VITALS — BP 150/85 | HR 75 | Ht 69.0 in | Wt 299.0 lb

## 2021-09-30 DIAGNOSIS — R0981 Nasal congestion: Secondary | ICD-10-CM | POA: Diagnosis not present

## 2021-09-30 DIAGNOSIS — J40 Bronchitis, not specified as acute or chronic: Secondary | ICD-10-CM | POA: Diagnosis not present

## 2021-09-30 DIAGNOSIS — U071 COVID-19: Secondary | ICD-10-CM | POA: Diagnosis not present

## 2021-09-30 MED ORDER — MUCINEX DM MAXIMUM STRENGTH 60-1200 MG PO TB12: 1.0000 | ORAL_TABLET | Freq: Two times a day (BID) | ORAL | 0 refills | Status: DC | PRN

## 2021-09-30 MED ORDER — ALBUTEROL SULFATE HFA 108 (90 BASE) MCG/ACT IN AERS: 2.0000 | INHALATION_SPRAY | Freq: Four times a day (QID) | RESPIRATORY_TRACT | 0 refills | Status: DC | PRN

## 2021-09-30 MED ORDER — SALINE SPRAY 0.65 % NA SOLN: 1.0000 | NASAL | 2 refills | Status: DC | PRN

## 2021-09-30 MED ORDER — FLUTICASONE PROPIONATE 50 MCG/ACT NA SUSP: 2.0000 | Freq: Every day | NASAL | 6 refills | Status: DC

## 2021-09-30 MED ORDER — HYDROCOD POLI-CHLORPHE POLI ER 10-8 MG/5ML PO SUER: 5.0000 mL | Freq: Every evening | ORAL | 0 refills | Status: DC | PRN

## 2021-09-30 MED ORDER — MOLNUPIRAVIR EUA 200MG CAPSULE: 4.0000 | ORAL_CAPSULE | Freq: Two times a day (BID) | ORAL | 0 refills | Status: AC

## 2021-09-30 NOTE — Progress Notes (Signed)
Virtual Visit via Video Note  I connected with Darren Perkins  on 09/30/21 at  8:30 AM EST by a video enabled telemedicine application and verified that I am speaking with the correct person using two identifiers.  Location patient: [place] Location provider:work or home office Persons participating in the virtual visit: patient, provider  I discussed the limitations and requested verbal permission for telemedicine visit. The patient expressed understanding and agreed to proceed.   HPI:  Acute telemedicine visit for : Covid 19 + 09/29/21 last week had diarrhea resolved, chills, sore throat, nasal congestion, able to taste and smell no fever tried otc expired since 2019 [facility] cough medication, no sinus pressure  No fever would hurt to cough last week and voice horse  Thinks covid from boss had 4/4 covid 19 shots  -Pertinent medication allergies:No Known Allergies -COVID-19 vaccine status:  Immunization History  Administered Date(s) Administered   Influenza,inj,Quad PF,6+ Mos 10/03/2018, 08/13/2020   Influenza-Unspecified 05/26/2021   PFIZER(Purple Top)SARS-COV-2 Vaccination 11/23/2019, 12/14/2019, 07/07/2020   Pfizer Covid-19 Vaccine Bivalent Booster 28yrs & up 05/26/2021     ROS: See pertinent positives and negatives per HPI.  Past Medical History:  Diagnosis Date   Hypertension     Past Surgical History:  Procedure Laterality Date   COLONOSCOPY WITH PROPOFOL N/A 03/17/2020   Procedure: COLONOSCOPY WITH PROPOFOL;  Surgeon: Toledo, Benay Pike, MD;  Location: [facility] ENDOSCOPY;  Service: Gastroenterology;  Laterality: N/A;   LAPAROSCOPIC GASTRIC BANDING     Dr. Flavia Shipper [place] 2008      Current Outpatient Medications:    albuterol (VENTOLIN HFA) 108 (90 Base) MCG/ACT inhaler, Inhale 2 puffs into the lungs every 6 (six) hours as needed for wheezing or shortness of breath., Disp: 18 g, Rfl: 0   amLODipine (NORVASC) 10 MG tablet, TAKE 1 TABLET(10 MG) BY MOUTH DAILY, Disp: 90 tablet, Rfl:  3   chlorpheniramine-HYDROcodone (TUSSIONEX PENNKINETIC ER) 10-8 MG/5ML, Take 5 mLs by mouth at bedtime as needed., Disp: 115 mL, Rfl: 0   Dextromethorphan-guaiFENesin (MUCINEX DM MAXIMUM STRENGTH) 60-1200 MG TB12, Take 1 tablet by mouth 2 (two) times daily as needed., Disp: 60 tablet, Rfl: 0   fluticasone (FLONASE) 50 MCG/ACT nasal spray, Place 2 sprays into both nostrils daily., Disp: 16 g, Rfl: 6   losartan-hydrochlorothiazide (HYZAAR) 100-25 MG tablet, Take 1 tablet by mouth daily. In am, Disp: 90 tablet, Rfl: 3   molnupiravir EUA (LAGEVRIO) 200 mg CAPS capsule, Take 4 capsules (800 mg total) by mouth 2 (two) times daily for 5 days., Disp: 40 capsule, Rfl: 0   sodium chloride (OCEAN) 0.65 % SOLN nasal spray, Place 1 spray into both nostrils as needed for congestion., Disp: 30 mL, Rfl: 2   Cholecalciferol 1.25 MG (50000 UT) capsule, Take 1 capsule (50,000 Units total) by mouth once a week. (Patient not taking: Reported on 09/30/2021), Disp: 13 capsule, Rfl: 1   sildenafil (REVATIO) 20 MG tablet, Take 1-5 tablets (20-100 mg total) by mouth as needed. 30 min to 4 hrs before sex (Patient not taking: Reported on 12/12/2020), Disp: 10 tablet, Rfl: 11  EXAM:  VITALS per patient if applicable:  GENERAL: alert, oriented, appears well and in no acute distress  HEENT: atraumatic, conjunttiva clear, no obvious abnormalities on inspection of external nose and ears  NECK: normal movements of the head and neck  LUNGS: on inspection no signs of respiratory distress, breathing rate appears normal, no obvious gross SOB, gasping or wheezing  CV: no obvious cyanosis  MS: moves all  visible extremities without noticeable abnormality  PSYCH/NEURO: pleasant and cooperative, no obvious depression or anxiety, speech and thought processing grossly intact  ASSESSMENT AND PLAN:  Discussed the following assessment and plan:  COVID-19 - Plan: molnupiravir EUA (LAGEVRIO) 200 mg CAPS capsule,  Dextromethorphan-guaiFENesin (MUCINEX DM MAXIMUM STRENGTH) 60-1200 MG TB12, chlorpheniramine-HYDROcodone (TUSSIONEX PENNKINETIC ER) 10-8 MG/5ML  Nasal congestion - Plan: sodium chloride (OCEAN) 0.65 % SOLN nasal spray, fluticasone (FLONASE) 50 MCG/ACT nasal spray  Bronchitis due to COVID-19 virus - Plan: molnupiravir EUA (LAGEVRIO) 200 mg CAPS capsule, Dextromethorphan-guaiFENesin (MUCINEX DM MAXIMUM STRENGTH) 60-1200 MG TB12, chlorpheniramine-HYDROcodone (TUSSIONEX PENNKINETIC ER) 10-8 MG/5ML, albuterol (VENTOLIN HFA) 108 (90 Base) MCG/ACT inhaler  -we discussed possible serious and likely etiologies, options for evaluation and workup, limitations of telemedicine visit vs in person visit, treatment, treatment risks and precautions. Pt is agreeable to treatment via telemedicine at this moment.  I discussed the assessment and treatment plan with the patient. The patient was provided an opportunity to ask questions and all were answered. The patient agreed with the plan and demonstrated an understanding of the instructions.    Time 20 minutes Delorise Jackson, MD

## 2021-09-30 NOTE — Patient Instructions (Signed)

## 2021-09-30 NOTE — Progress Notes (Signed)
Oscillococcinum and ibuprofen is what Patient is taking for his symptoms.

## 2021-11-13 ENCOUNTER — Other Ambulatory Visit: Payer: Self-pay | Admitting: Internal Medicine

## 2021-11-13 DIAGNOSIS — U071 COVID-19: Secondary | ICD-10-CM

## 2021-11-13 DIAGNOSIS — J40 Bronchitis, not specified as acute or chronic: Secondary | ICD-10-CM

## 2021-11-16 NOTE — Telephone Encounter (Signed)
Pt requesting rx for cough syrup...  ? ?Has not been seen since 09/30/2021.  ? ? is having sx?  ?

## 2021-12-02 ENCOUNTER — Other Ambulatory Visit: Payer: Self-pay | Admitting: Internal Medicine

## 2021-12-02 ENCOUNTER — Encounter: Payer: Self-pay | Admitting: Internal Medicine

## 2021-12-02 ENCOUNTER — Other Ambulatory Visit: Payer: Self-pay

## 2021-12-02 ENCOUNTER — Ambulatory Visit (INDEPENDENT_AMBULATORY_CARE_PROVIDER_SITE_OTHER): Payer: BC Managed Care – PPO | Admitting: Internal Medicine

## 2021-12-02 VITALS — BP 132/86 | HR 89 | Temp 98.3°F | Ht 69.0 in | Wt 302.0 lb

## 2021-12-02 DIAGNOSIS — I152 Hypertension secondary to endocrine disorders: Secondary | ICD-10-CM

## 2021-12-02 DIAGNOSIS — Z0000 Encounter for general adult medical examination without abnormal findings: Secondary | ICD-10-CM

## 2021-12-02 DIAGNOSIS — Z23 Encounter for immunization: Secondary | ICD-10-CM | POA: Diagnosis not present

## 2021-12-02 DIAGNOSIS — Z6841 Body Mass Index (BMI) 40.0 and over, adult: Secondary | ICD-10-CM

## 2021-12-02 DIAGNOSIS — E559 Vitamin D deficiency, unspecified: Secondary | ICD-10-CM | POA: Diagnosis not present

## 2021-12-02 DIAGNOSIS — Z125 Encounter for screening for malignant neoplasm of prostate: Secondary | ICD-10-CM

## 2021-12-02 DIAGNOSIS — I10 Essential (primary) hypertension: Secondary | ICD-10-CM

## 2021-12-02 DIAGNOSIS — E1159 Type 2 diabetes mellitus with other circulatory complications: Secondary | ICD-10-CM

## 2021-12-02 DIAGNOSIS — N529 Male erectile dysfunction, unspecified: Secondary | ICD-10-CM

## 2021-12-02 DIAGNOSIS — Z1329 Encounter for screening for other suspected endocrine disorder: Secondary | ICD-10-CM | POA: Diagnosis not present

## 2021-12-02 DIAGNOSIS — M1712 Unilateral primary osteoarthritis, left knee: Secondary | ICD-10-CM | POA: Diagnosis not present

## 2021-12-02 LAB — CBC WITH DIFFERENTIAL/PLATELET
Basophils Absolute: 0.1 10*3/uL (ref 0.0–0.1)
Basophils Relative: 0.8 % (ref 0.0–3.0)
Eosinophils Absolute: 0.2 10*3/uL (ref 0.0–0.7)
Eosinophils Relative: 2.1 % (ref 0.0–5.0)
HCT: 41.2 % (ref 39.0–52.0)
Hemoglobin: 13.5 g/dL (ref 13.0–17.0)
Lymphocytes Relative: 15.4 % (ref 12.0–46.0)
Lymphs Abs: 1.7 10*3/uL (ref 0.7–4.0)
MCHC: 32.7 g/dL (ref 30.0–36.0)
MCV: 76.8 fl — ABNORMAL LOW (ref 78.0–100.0)
Monocytes Absolute: 1 10*3/uL (ref 0.1–1.0)
Monocytes Relative: 8.9 % (ref 3.0–12.0)
Neutro Abs: 8.2 10*3/uL — ABNORMAL HIGH (ref 1.4–7.7)
Neutrophils Relative %: 72.8 % (ref 43.0–77.0)
Platelets: 339 10*3/uL (ref 150.0–400.0)
RBC: 5.37 Mil/uL (ref 4.22–5.81)
RDW: 17.4 % — ABNORMAL HIGH (ref 11.5–15.5)
WBC: 11.2 10*3/uL — ABNORMAL HIGH (ref 4.0–10.5)

## 2021-12-02 LAB — COMPREHENSIVE METABOLIC PANEL
ALT: 24 U/L (ref 0–53)
AST: 21 U/L (ref 0–37)
Albumin: 5 g/dL (ref 3.5–5.2)
Alkaline Phosphatase: 74 U/L (ref 39–117)
BUN: 12 mg/dL (ref 6–23)
CO2: 28 mEq/L (ref 19–32)
Calcium: 10.3 mg/dL (ref 8.4–10.5)
Chloride: 103 mEq/L (ref 96–112)
Creatinine, Ser: 1.02 mg/dL (ref 0.40–1.50)
GFR: 84.3 mL/min (ref >60.00)
Glucose, Bld: 103 mg/dL — ABNORMAL HIGH (ref 70–99)
Potassium: 4 mEq/L (ref 3.5–5.1)
Sodium: 140 mEq/L (ref 135–145)
Total Bilirubin: 0.5 mg/dL (ref 0.2–1.2)
Total Protein: 7.3 g/dL (ref 6.0–8.3)

## 2021-12-02 LAB — VITAMIN D 25 HYDROXY (VIT D DEFICIENCY, FRACTURES): VITD: 19.02 ng/mL — ABNORMAL LOW (ref 30.00–100.00)

## 2021-12-02 LAB — LIPID PANEL
Cholesterol: 166 mg/dL (ref 0–200)
HDL: 59.4 mg/dL (ref >39.00)
LDL Cholesterol: 96 mg/dL (ref 0–99)
NonHDL: 107.03
Total CHOL/HDL Ratio: 3
Triglycerides: 57 mg/dL (ref 0.0–149.0)
VLDL: 11.4 mg/dL (ref 0.0–40.0)

## 2021-12-02 LAB — HEMOGLOBIN A1C: Hgb A1c MFr Bld: 6.6 % — ABNORMAL HIGH (ref 4.6–6.5)

## 2021-12-02 LAB — TSH: TSH: 1.31 u[IU]/mL (ref 0.35–5.50)

## 2021-12-02 LAB — PSA: PSA: 0.19 ng/mL (ref 0.10–4.00)

## 2021-12-02 MED ORDER — OZEMPIC (0.25 OR 0.5 MG/DOSE) 2 MG/1.5ML ~~LOC~~ SOPN: 0.2500 mg | PEN_INJECTOR | SUBCUTANEOUS | 2 refills | Status: DC

## 2021-12-02 MED ORDER — AMLODIPINE BESYLATE 10 MG PO TABS: ORAL_TABLET | ORAL | 3 refills | Status: DC

## 2021-12-02 MED ORDER — CHOLECALCIFEROL 1.25 MG (50000 UT) PO CAPS: 50000.0000 [IU] | ORAL_CAPSULE | ORAL | 1 refills | Status: DC

## 2021-12-02 MED ORDER — SILDENAFIL CITRATE 20 MG PO TABS: 20.0000 mg | ORAL_TABLET | ORAL | 11 refills | Status: DC | PRN

## 2021-12-02 MED ORDER — LOSARTAN POTASSIUM-HCTZ 100-25 MG PO TABS: 1.0000 | ORAL_TABLET | Freq: Every day | ORAL | 3 refills | Status: DC

## 2021-12-02 NOTE — Patient Instructions (Addendum)
Call [facility] clinic for an appt for injection if needed  ?Details ?Date Type Department Care Team Description  ?12/31/2020 Office Visit [facility]   ?[place]   ?[place], [place] [address]   ?[phone]   Lauris Poag, MD   ?[place]   ?[place]   ?[place], [place] [address]   ?[phone] (Work)   ?[phone] (Fax)   ?  ?[place], [place]   ?[place], [place] [address]   ?[phone] (Work)   ?[phone] (Fax)   Primary osteoarthritis of left knee (Primary Dx);  ?Rotator cuff syndrome, right  ?Tdap (Tetanus, Diphtheria, Pertussis) Vaccine: What You Need to Know ?1. Why get vaccinated? ?Tdap vaccine can prevent tetanus, diphtheria, and pertussis. ?Diphtheria and pertussis spread from person to person. Tetanus enters the body through cuts or wounds. ?TETANUS (T) causes painful stiffening of the muscles. Tetanus can lead to serious health problems, including being unable to open the mouth, having trouble swallowing and breathing, or death. ?DIPHTHERIA (D) can lead to difficulty breathing, heart failure, paralysis, or death. ?PERTUSSIS (aP), also known as "whooping cough," can cause uncontrollable, violent coughing that makes it hard to breathe, eat, or drink. Pertussis can be extremely serious especially in babies and young children, causing pneumonia, convulsions, brain damage, or death. In teens and adults, it can cause weight loss, loss of bladder control, passing out, and rib fractures from severe coughing. ?2. Tdap vaccine ?Tdap is only for children 7 years and older, adolescents, and adults.  ?Adolescents should receive a single dose of Tdap, preferably at age 33 or 86 years. ?Pregnant people should get a dose of Tdap during every pregnancy, preferably during the early part of the third trimester, to help protect the newborn from pertussis. Infants are most at risk for severe, life-threatening complications from  pertussis. ?Adults who have never received Tdap should get a dose of Tdap. ?Also, adults should receive a booster dose of either Tdap or Td (a different vaccine that protects against tetanus and diphtheria but not pertussis) every 10 years, or after 5 years in the case of a severe or dirty wound or burn. ?Tdap may be given at the same time as other vaccines. ?3. Talk with your health care provider ?Tell your vaccine provider if the person getting the vaccine: ?Has had an allergic reaction after a previous dose of any vaccine that protects against tetanus, diphtheria, or pertussis, or has any severe, life-threatening allergies ?Has had a coma, decreased level of consciousness, or prolonged seizures within 7 days after a previous dose of any pertussis vaccine (DTP, DTaP, or Tdap) ?Has seizures or another nervous system problem ?Has ever had Guillain-Barr? Syndrome (also called "GBS") ?Has had severe pain or swelling after a previous dose of any vaccine that protects against tetanus or diphtheria ?In some cases, your health care provider may decide to postpone Tdap vaccination until a future visit. ?People with minor illnesses, such as a cold, may be vaccinated. People who are moderately or severely ill should usually wait until they recover before getting Tdap vaccine.  ?Your health care provider can give you more information. ?4. Risks of a vaccine reaction ?Pain, redness, or swelling where the shot was given, mild fever, headache, feeling tired, and nausea, vomiting, diarrhea, or stomachache sometimes happen after Tdap vaccination. ?People sometimes faint after medical procedures, including vaccination. Tell your provider if you feel dizzy or have vision changes or ringing in the ears.  ?As with  any medicine, there is a very remote chance of a vaccine causing a severe allergic reaction, other serious injury, or death. ?5. What if there is a serious problem? ?An allergic reaction could occur after the vaccinated  person leaves the clinic. If you see signs of a severe allergic reaction (hives, swelling of the face and throat, difficulty breathing, a fast heartbeat, dizziness, or weakness), call 9-1-1 and get the person to the nearest hospital. ?For other signs that concern you, call your health care provider.  ?Adverse reactions should be reported to the Vaccine Adverse Event Reporting System (VAERS). Your health care provider will usually file this report, or you can do it yourself. Visit the VAERS website at www.vaers.SamedayNews.es or call 772-164-4086. VAERS is only for reporting reactions, and VAERS staff members do not give medical advice. ?6. The National Vaccine Injury Compensation Program ?The National Vaccine Injury Compensation Program (VICP) is a federal program that was created to compensate people who may have been injured by certain vaccines. Claims regarding alleged injury or death due to vaccination have a time limit for filing, which may be as short as two years. Visit the VICP website at GoldCloset.com.ee or call 6036911738 to learn about the program and about filing a claim. ?7. How can I learn more? ?Ask your health care provider. ?Call your local or state health department. ?Visit the website of the Food and Drug Administration (FDA) for vaccine package inserts and additional information at TraderRating.uy. ?Contact the Centers for Disease Control and Prevention (CDC): ?Call (858)770-7369 (1-800-CDC-INFO) or ?Visit CDC's website at http://hunter.com/. ?Vaccine Information Statement Tdap (Tetanus, Diphtheria, Pertussis) Vaccine (04/11/2020) ?This information is not intended to replace advice given to you by your health care provider. Make sure you discuss any questions you have with your health care provider. ?Document Revised: 05/07/2020 Document Reviewed: 05/07/2020 ?Elsevier Patient Education ? [place]. ? ?

## 2021-12-02 NOTE — Progress Notes (Signed)
Chief Complaint  ?Patient presents with  ? Annual Exam  ? ?Annual  ?1. Left knee pain this week h/o steroid shots x 2 in 2021 and 2022 pain with bending will call and sch [facility] some swelling  ?2. Htn on novasc 10 mg qd hyzaar 100-25 mg qd sl elevated ?3. Obesity s/p gastric sleeve wants to try injectable medications ? ?Review of Systems  ?Constitutional:  Negative for weight loss.  ?HENT:  Negative for hearing loss.   ?Eyes:  Negative for blurred vision.  ?Respiratory:  Negative for shortness of breath.   ?Cardiovascular:  Negative for chest pain.  ?Gastrointestinal:  Negative for abdominal pain and blood in stool.  ?Musculoskeletal:  Positive for joint pain. Negative for back pain.  ?Skin:  Negative for rash.  ?Neurological:  Negative for headaches.  ?Psychiatric/Behavioral:  Negative for depression.   ?Past Medical History:  ?Diagnosis Date  ? Hypertension   ? ?Past Surgical History:  ?Procedure Laterality Date  ? COLONOSCOPY WITH PROPOFOL N/A 03/17/2020  ? Procedure: COLONOSCOPY WITH PROPOFOL;  Surgeon: Toledo, Benay Pike, MD;  Location: [facility] ENDOSCOPY;  Service: Gastroenterology;  Laterality: N/A;  ? LAPAROSCOPIC GASTRIC BANDING    ? Dr. Flavia Shipper [place] 2008   ? ?Family History  ?Problem Relation Age of Onset  ? Arthritis Mother   ? Hypertension Mother   ? Alcohol abuse Father   ? Diabetes Father   ? Hypertension Brother   ? Hypertension Brother   ? Bipolar disorder Daughter   ? ?Social History  ? ?Socioeconomic History  ? Marital status: Married  ?  Spouse name: Not on file  ? Number of children: Not on file  ? Years of education: Not on file  ? Highest education level: Not on file  ?Occupational History  ? Not on file  ?Tobacco Use  ? Smoking status: Never  ? Smokeless tobacco: Never  ?Vaping Use  ? Vaping Use: Never used  ?Substance and Sexual Activity  ? Alcohol use: Not Currently  ? Drug use: Not Currently  ? Sexual activity: Yes  ?  Comment: wife   ?Other Topics Concern  ? Not on file  ?Social History  Narrative  ? Wears seat belt, safe in relationship   ? 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 Married-Karen   ? Son and daughter   ? ?Social Determinants of Health  ? ?Financial Resource Strain: Not on file  ?Food Insecurity: Not on file  ?Transportation Needs: Not on file  ?Physical Activity: Not on file  ?Stress: Not on file  ?Social Connections: Not on file  ?Intimate Partner Violence: Not on file  ? ?Current Meds  ?Medication Sig  ? Cholecalciferol (VITAMIN D3 GUMMIES PO) Take 2 each by mouth daily.  ? [DISCONTINUED] amLODipine (NORVASC) 10 MG tablet TAKE 1 TABLET(10 MG) BY MOUTH DAILY  ? [DISCONTINUED] losartan-hydrochlorothiazide (HYZAAR) 100-25 MG tablet Take 1 tablet by mouth daily. In am  ? ?No Known Allergies ?No results found for this or any previous visit (from the past 2160 hour(s)). ?Objective  ?Body mass index is 44.6 kg/m?. ?Wt Readings from Last 3 Encounters:  ?12/02/21 (!) 302 lb (137 kg)  ?09/30/21 299 lb (135.6 kg)  ?12/12/20 (!) 304 lb (137.9 kg)  ? ?Temp Readings from Last 3 Encounters:  ?12/02/21 98.3 ?F (36.8 ?C) (Oral)  ?12/12/20 98.1 ?F (36.7 ?C) (Oral)  ?08/13/20 98.5 ?F (36.9 ?C) (Oral)  ? ?BP Readings from Last 3  Encounters:  ?12/02/21 132/86  ?09/30/21 (!) 150/85  ?12/12/20 140/90  ? ?Pulse Readings from Last 3 Encounters:  ?12/02/21 89  ?09/30/21 75  ?12/12/20 76  ? ? ?Physical Exam ?Vitals and nursing note reviewed.  ?Constitutional:   ?   Appearance: Normal appearance. He is well-developed and well-groomed.  ?HENT:  ?   Head: Normocephalic and atraumatic.  ?Eyes:  ?   Conjunctiva/sclera: Conjunctivae normal.  ?   Pupils: Pupils are equal, round, and reactive to light.  ?Cardiovascular:  ?   Rate and Rhythm: Normal rate and regular rhythm.  ?   Heart sounds: Normal heart sounds.  ?Pulmonary:  ?   Effort: Pulmonary effort is normal. No respiratory distress.  ?   Breath sounds: Normal breath sounds.  ?Abdominal:  ?   Tenderness:  There is no abdominal tenderness.  ?Skin: ?   General: Skin is warm and moist.  ?Neurological:  ?   General: No focal deficit present.  ?   Mental Status: He is alert and oriented to person, place, and time. Mental status is at baseline.  ?   Sensory: Sensation is intact.  ?   Motor: Motor function is intact.  ?   Coordination: Coordination is intact.  ?   Gait: Gait is intact. Gait normal.  ?Psychiatric:     ?   Attention and Perception: Attention and perception normal.     ?   Mood and Affect: Mood and affect normal.     ?   Speech: Speech normal.     ?   Behavior: Behavior normal. Behavior is cooperative.     ?   Thought Content: Thought content normal.     ?   Cognition and Memory: Cognition and memory normal.     ?   Judgment: Judgment normal.  ? ? ?Assessment  ?Plan  ?Annual physical exam ?See below ? ?Arthritis of knee, left ?Call [facility]  ? ?Primary hypertension - Plan: amLODipine (NORVASC) 10 MG tablet work on healthy diet and exercise ?Hyzaar 100-25 ?Norvasc 10 mg qd  ?Consider bystolic low dose in the future ? ?Hypertension associated with diabetes ([place]) - Plan: Comprehensive metabolic panel, Lipid panel, CBC w/Diff, Hemoglobin A1c, Urinalysis, Routine w reflex microscopic, Microalbumin / creatinine urine ratio, ?Consider ozempic, mounjaro ? ?Erectile dysfunction, unspecified erectile dysfunction type - Plan: sildenafil (REVATIO) 20 MG tablet ? ? ?HM ?Flu shot gutd ?covid 3/3 ?Tdap given today ? fasting labs today   ?  ?Colonoscopy 03/17/2020 [facility] tubular FH in 5 years x 2 polyps  ?PSA 0.22 11/12/19 due at f/u ?rec healthy diet and exercise and salt reduction  ?Hep C ?neg ?declines HIV  ? ?Provider: Dr. Olivia Mackie McLean-Scocuzza-[facility]  ?

## 2021-12-02 NOTE — Addendum Note (Signed)
Addended by: Orland Mustard on: 12/02/2021 03:52 PM ? ? Modules accepted: Orders ? ?

## 2021-12-03 LAB — URINALYSIS, ROUTINE W REFLEX MICROSCOPIC
Bilirubin Urine: NEGATIVE
Glucose, UA: NEGATIVE
Hgb urine dipstick: NEGATIVE
Ketones, ur: NEGATIVE
Leukocytes,Ua: NEGATIVE
Nitrite: NEGATIVE
Protein, ur: NEGATIVE
Specific Gravity, Urine: 1.011 (ref 1.001–1.035)
pH: 7.5 (ref 5.0–8.0)

## 2021-12-03 LAB — MICROALBUMIN / CREATININE URINE RATIO
Creatinine, Urine: 73 mg/dL (ref 20–320)
Microalb, Ur: <0.2 mg/dL

## 2021-12-04 ENCOUNTER — Other Ambulatory Visit: Payer: Self-pay | Admitting: Internal Medicine

## 2021-12-04 NOTE — Telephone Encounter (Signed)
Insurance will cover trulicity does he want to try this ? Its cousin of ozempic ?He may have to try this 1st  ? ?

## 2021-12-07 ENCOUNTER — Telehealth: Payer: Self-pay | Admitting: Internal Medicine

## 2021-12-07 NOTE — Telephone Encounter (Signed)
Prior authorization has been submitted for patient's Sildenafil  ? ?Awaiting approval or denial.  ? ?

## 2021-12-08 MED ORDER — TRULICITY 0.75 MG/0.5ML ~~LOC~~ SOAJ: 0.7500 mg | SUBCUTANEOUS | 2 refills | Status: DC

## 2021-12-08 NOTE — Addendum Note (Signed)
Addended by: Nanci Pina on: 12/08/2021 02:03 PM ? ? Modules accepted: Orders ? ?

## 2021-12-08 NOTE — Telephone Encounter (Signed)
Patient willing to try Trulicity what dosage? ?

## 2021-12-24 ENCOUNTER — Telehealth: Payer: Self-pay | Admitting: Internal Medicine

## 2021-12-24 NOTE — Telephone Encounter (Signed)
Pt called stating his insurance will not cover Dulaglutide. Pt is requesting recommendations and want a call back ?

## 2021-12-28 ENCOUNTER — Encounter: Payer: Self-pay | Admitting: Internal Medicine

## 2021-12-28 DIAGNOSIS — Z6841 Body Mass Index (BMI) 40.0 and over, adult: Secondary | ICD-10-CM | POA: Insufficient documentation

## 2021-12-28 NOTE — Telephone Encounter (Signed)
We sent ozempic then insurance stated trulicity covered sent this in not covered  ?Juliann Pulse can you help?  ?Have pt call also call and see what is covered?  ?He has diabetes now  ? ?

## 2021-12-29 ENCOUNTER — Encounter: Payer: Self-pay | Admitting: *Deleted

## 2021-12-29 ENCOUNTER — Ambulatory Visit: Payer: BC Managed Care – PPO | Admitting: Internal Medicine

## 2021-12-29 NOTE — Telephone Encounter (Signed)
Yes pls do this I think I did at last visit ? ?

## 2021-12-29 NOTE — Telephone Encounter (Signed)
Since A1c now 6.6 can I use type 2 diabetes to qualify patient. ?

## 2022-01-27 ENCOUNTER — Other Ambulatory Visit: Payer: Self-pay | Admitting: Internal Medicine

## 2022-01-27 ENCOUNTER — Encounter: Payer: Self-pay | Admitting: Internal Medicine

## 2022-01-27 DIAGNOSIS — E119 Type 2 diabetes mellitus without complications: Secondary | ICD-10-CM

## 2022-01-27 MED ORDER — TRULICITY 1.5 MG/0.5ML ~~LOC~~ SOAJ: 1.5000 mg | SUBCUTANEOUS | 0 refills | Status: DC

## 2022-01-29 DIAGNOSIS — Z6841 Body Mass Index (BMI) 40.0 and over, adult: Secondary | ICD-10-CM | POA: Insufficient documentation

## 2022-01-29 MED ORDER — OZEMPIC (1 MG/DOSE) 4 MG/3ML ~~LOC~~ SOPN: 1.0000 mg | PEN_INJECTOR | SUBCUTANEOUS | 3 refills | Status: DC

## 2022-01-29 NOTE — Telephone Encounter (Signed)
Pt called wanting to know about PA for medication. Pt states that he takes it on Saturday and does not know what would happen if he miss a dose

## 2022-01-29 NOTE — Addendum Note (Signed)
Addended by: Orland Mustard on: 01/29/2022 05:39 PM   Modules accepted: Orders

## 2022-02-03 ENCOUNTER — Telehealth: Payer: Self-pay

## 2022-02-03 NOTE — Telephone Encounter (Signed)
PA has been started for pt's Semaglutide, 1 MG/DOSE, (OZEMPIC, 1 MG/DOSE,) 4 MG/3ML SOPN via covermymeds on 02/03/22  Key: [id number] Rx #: [id number]  Awaiting denial or approval

## 2022-02-24 ENCOUNTER — Other Ambulatory Visit: Payer: Self-pay

## 2022-02-24 ENCOUNTER — Telehealth: Payer: Self-pay | Admitting: Internal Medicine

## 2022-02-24 DIAGNOSIS — I10 Essential (primary) hypertension: Secondary | ICD-10-CM

## 2022-02-24 MED ORDER — LOSARTAN POTASSIUM-HCTZ 100-25 MG PO TABS: 1.0000 | ORAL_TABLET | Freq: Every day | ORAL | 3 refills | Status: DC

## 2022-02-24 NOTE — Telephone Encounter (Signed)
Pt need refill on losartan-hydrochlorothiazide sent to [facility] and pt is checking on ozempic medication

## 2022-05-18 ENCOUNTER — Telehealth: Payer: Self-pay

## 2022-05-18 NOTE — Telephone Encounter (Signed)
Called pt he declined to go to the ED wanted to keep his OV with Dr. Olivia Mackie instead:

## 2022-05-18 NOTE — Telephone Encounter (Signed)
Fyi orders in ekg and labs for appt 05/19/22

## 2022-05-18 NOTE — Progress Notes (Unsigned)
No chief complaint on file.  HPI ROS Past Medical History:  Diagnosis Date   Hypertension    Past Surgical History:  Procedure Laterality Date   COLONOSCOPY WITH PROPOFOL N/A 03/17/2020   Procedure: COLONOSCOPY WITH PROPOFOL;  Surgeon: Toledo, Benay Pike, MD;  Location: [facility] ENDOSCOPY;  Service: Gastroenterology;  Laterality: N/A;   LAPAROSCOPIC GASTRIC BANDING     Dr. Flavia Shipper [place] 2008    Family History  Problem Relation Age of Onset   Arthritis Mother    Hypertension Mother    Alcohol abuse Father    Diabetes Father    Hypertension Brother    Hypertension Brother    Bipolar disorder Daughter    Social History   Socioeconomic History   Marital status: Married    Spouse name: Not on file   Number of children: Not on file   Years of education: Not on file   Highest education level: Not on file  Occupational History   Not on file  Tobacco Use   Smoking status: Never   Smokeless tobacco: Never  Vaping Use   Vaping Use: Never used  Substance and Sexual Activity   Alcohol use: Not Currently   Drug use: Not Currently   Sexual activity: Yes    Comment: wife   Other Topics Concern   Not on file  Social History Narrative   Wears seat belt, safe in relationship    Associates degree, 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   No Known Allergies No results found for this or any previous visit (from the past 2160 hour(s)). Objective  There is no height or weight on file to calculate BMI. Wt Readings from  Last 3 Encounters:  12/02/21 (!) 302 lb (137 kg)  09/30/21 299 lb (135.6 kg)  12/12/20 (!) 304 lb (137.9 kg)   Temp Readings from Last 3 Encounters:  12/02/21 98.3 F (36.8 C) (Oral)  12/12/20 98.1 F (36.7 C) (Oral)  08/13/20 98.5 F (36.9 C) (Oral)   BP Readings from Last 3 Encounters:  12/02/21 132/86  09/30/21 (!) 150/85  12/12/20 140/90   Pulse Readings from Last 3 Encounters:  12/02/21 89  09/30/21 75  12/12/20 76    Physical Exam  Assessment  Plan  Chest pain, unspecified type - Plan: EKG 12-Lead, Troponin I (High Sensitivity), D-Dimer, Quantitative  Hypertension associated with diabetes ([place]) - Plan: Comprehensive metabolic panel, Lipid panel, CBC with Differential/Platelet, Hemoglobin A1c  Vitamin D deficiency - Plan: Vitamin D (25 hydroxy)  Provider: Dr. Olivia Mackie McLean-Scocuzza-[facility]

## 2022-05-19 ENCOUNTER — Ambulatory Visit (INDEPENDENT_AMBULATORY_CARE_PROVIDER_SITE_OTHER): Payer: Commercial Managed Care - PPO | Admitting: Internal Medicine

## 2022-05-19 ENCOUNTER — Encounter: Payer: Self-pay | Admitting: Internal Medicine

## 2022-05-19 VITALS — BP 154/88 | HR 81 | Temp 99.0°F | Ht 69.0 in | Wt 277.2 lb

## 2022-05-19 DIAGNOSIS — R079 Chest pain, unspecified: Secondary | ICD-10-CM | POA: Diagnosis not present

## 2022-05-19 DIAGNOSIS — I152 Hypertension secondary to endocrine disorders: Secondary | ICD-10-CM | POA: Diagnosis not present

## 2022-05-19 DIAGNOSIS — I10 Essential (primary) hypertension: Secondary | ICD-10-CM

## 2022-05-19 DIAGNOSIS — E1159 Type 2 diabetes mellitus with other circulatory complications: Secondary | ICD-10-CM

## 2022-05-19 DIAGNOSIS — E559 Vitamin D deficiency, unspecified: Secondary | ICD-10-CM | POA: Diagnosis not present

## 2022-05-19 DIAGNOSIS — Z903 Acquired absence of stomach [part of]: Secondary | ICD-10-CM

## 2022-05-19 DIAGNOSIS — Z6841 Body Mass Index (BMI) 40.0 and over, adult: Secondary | ICD-10-CM

## 2022-05-19 LAB — D-DIMER, QUANTITATIVE: D-Dimer, Quant: 0.44 mcg/mL FEU (ref <0.50)

## 2022-05-19 LAB — COMPREHENSIVE METABOLIC PANEL
ALT: 19 U/L (ref 0–53)
AST: 23 U/L (ref 0–37)
Albumin: 4.5 g/dL (ref 3.5–5.2)
Alkaline Phosphatase: 73 U/L (ref 39–117)
BUN: 12 mg/dL (ref 6–23)
CO2: 27 mEq/L (ref 19–32)
Calcium: 9.7 mg/dL (ref 8.4–10.5)
Chloride: 103 mEq/L (ref 96–112)
Creatinine, Ser: 1.08 mg/dL (ref 0.40–1.50)
GFR: 78.45 mL/min (ref >60.00)
Glucose, Bld: 82 mg/dL (ref 70–99)
Potassium: 3.3 mEq/L — ABNORMAL LOW (ref 3.5–5.1)
Sodium: 140 mEq/L (ref 135–145)
Total Bilirubin: 0.8 mg/dL (ref 0.2–1.2)
Total Protein: 7.5 g/dL (ref 6.0–8.3)

## 2022-05-19 LAB — CBC WITH DIFFERENTIAL/PLATELET
Basophils Absolute: 0.1 10*3/uL (ref 0.0–0.1)
Basophils Relative: 0.8 % (ref 0.0–3.0)
Eosinophils Absolute: 0.3 10*3/uL (ref 0.0–0.7)
Eosinophils Relative: 2.4 % (ref 0.0–5.0)
HCT: 41.2 % (ref 39.0–52.0)
Hemoglobin: 13.3 g/dL (ref 13.0–17.0)
Lymphocytes Relative: 19.1 % (ref 12.0–46.0)
Lymphs Abs: 2.2 10*3/uL (ref 0.7–4.0)
MCHC: 32.3 g/dL (ref 30.0–36.0)
MCV: 79.2 fl (ref 78.0–100.0)
Monocytes Absolute: 1.1 10*3/uL — ABNORMAL HIGH (ref 0.1–1.0)
Monocytes Relative: 9.8 % (ref 3.0–12.0)
Neutro Abs: 7.7 10*3/uL (ref 1.4–7.7)
Neutrophils Relative %: 67.9 % (ref 43.0–77.0)
Platelets: 314 10*3/uL (ref 150.0–400.0)
RBC: 5.2 Mil/uL (ref 4.22–5.81)
RDW: 16.6 % — ABNORMAL HIGH (ref 11.5–15.5)
WBC: 11.3 10*3/uL — ABNORMAL HIGH (ref 4.0–10.5)

## 2022-05-19 LAB — LIPID PANEL
Cholesterol: 158 mg/dL (ref 0–200)
HDL: 51.7 mg/dL (ref >39.00)
LDL Cholesterol: 95 mg/dL (ref 0–99)
NonHDL: 106.64
Total CHOL/HDL Ratio: 3
Triglycerides: 60 mg/dL (ref 0.0–149.0)
VLDL: 12 mg/dL (ref 0.0–40.0)

## 2022-05-19 LAB — TROPONIN I (HIGH SENSITIVITY): High Sens Troponin I: 5 ng/L (ref 2–17)

## 2022-05-19 LAB — VITAMIN D 25 HYDROXY (VIT D DEFICIENCY, FRACTURES): VITD: 29.63 ng/mL — ABNORMAL LOW (ref 30.00–100.00)

## 2022-05-19 MED ORDER — AMLODIPINE BESYLATE 10 MG PO TABS: ORAL_TABLET | ORAL | 3 refills | Status: DC

## 2022-05-19 MED ORDER — LOSARTAN POTASSIUM-HCTZ 100-25 MG PO TABS: 1.0000 | ORAL_TABLET | Freq: Every day | ORAL | 3 refills | Status: DC

## 2022-05-19 MED ORDER — SEMAGLUTIDE (2 MG/DOSE) 8 MG/3ML ~~LOC~~ SOPN: 2.0000 mg | PEN_INJECTOR | SUBCUTANEOUS | 3 refills | Status: DC

## 2022-05-19 NOTE — Patient Instructions (Addendum)
Ice, heat, voltaren gel, Tylenol   [facility]   [place], [place] [address]   Julian, Benedict Needy, MD   [address]   [place], [place] [address]   [phone]   [phone] (Fax)   Weight gain (Primary Dx)    Nonspecific Chest Pain, Adult Chest pain is an uncomfortable, tight, or painful feeling in the chest. The pain can feel like a crushing, aching, or squeezing pressure. A person can feel a burning or tingling sensation. Chest pain can also be felt in your back, neck, jaw, shoulder, or arm. This pain can be worse when you move, sneeze, or take a deep breath. Chest pain can be caused by a condition that is life-threatening. This must be treated right away. It can also be caused by something that is not life-threatening. If you have chest pain, it can be hard to know the difference, so it is important to get help right away to make sure that you do not have a serious condition. Some life-threatening causes of chest pain include: Heart attack. A tear in the body's main blood vessel (aortic dissection). Inflammation around your heart (pericarditis). A problem in the lungs, such as a blood clot (pulmonary embolism) or a collapsed lung (pneumothorax). Some non life-threatening causes of chest pain include: Heartburn. Anxiety or stress. Damage to the bones, muscles, and cartilage that make up your chest wall. Pneumonia or bronchitis. Shingles infection (varicella-zoster virus). Your chest pain may come and go. It may also be constant. Your health care provider will do tests and other studies to find the cause of your pain. Treatment will depend on the cause of your chest pain. Follow these instructions at home: Medicines Take over-the-counter and prescription medicines only as told by your health care provider. If you were prescribed an antibiotic medicine, take it as told by your health care provider. Do not stop taking  the antibiotic even if you start to feel better. Activity Avoid any activities that cause chest pain. Do not lift anything that is heavier than 10 lb (4.5 kg), or the limit that you are told, until your health care provider says that it is safe. Rest as directed by your health care provider. Return to your normal activities only as told by your health care provider. Ask your health care provider what activities are safe for you. Lifestyle     Do not use any products that contain nicotine or tobacco, such as cigarettes, e-cigarettes, and chewing tobacco. If you need help quitting, ask your health care provider. Do not drink alcohol. Make healthy lifestyle changes as recommended. These may include: Getting regular exercise. Ask your health care provider to suggest some exercises that are safe for you. Eating a heart-healthy diet. This includes plenty of fresh fruits and vegetables, whole grains, low-fat (lean) protein, and low-fat dairy products. A dietitian can help you find healthy eating options. Maintaining a healthy weight. Managing any other health conditions you may have, such as high blood pressure (hypertension) or diabetes. Reducing stress, such as with yoga or relaxation techniques. General instructions Pay attention to any changes in your symptoms. It is up to you to get the results of any tests that were done. Ask your health care provider, or the department that is doing the tests, when your results will be ready. Keep all follow-up visits as told by your health care provider. This is important. You may be asked to go  for further testing if your chest pain does not go away. Contact a health care provider if: Your chest pain does not go away. You feel depressed. You have a fever. You notice changes in your symptoms or develop new symptoms. Get help right away if: Your chest pain gets worse. You have a cough that gets worse, or you cough up blood. You have severe pain in your  abdomen. You faint. You have sudden, unexplained chest discomfort. You have sudden, unexplained discomfort in your arms, back, neck, or jaw. You have shortness of breath at any time. You suddenly start to sweat, or your skin gets clammy. You feel nausea or you vomit. You suddenly feel lightheaded or dizzy. You have severe weakness, or unexplained weakness or fatigue. Your heart begins to beat quickly, or it feels like it is skipping beats. These symptoms may represent a serious problem that is an emergency. Do not wait to see if the symptoms will go away. Get medical help right away. Call your local emergency services (911 in the [place] Do not drive yourself to the hospital. Summary Chest pain can be caused by a condition that is serious and requires urgent treatment. It may also be caused by something that is not life-threatening. Your health care provider may do lab tests and other studies to find the cause of your pain. Follow your health care provider's instructions on taking medicines, making lifestyle changes, and getting emergency treatment if symptoms become worse. Keep all follow-up visits as told by your health care provider. This includes visits for any further testing if your chest pain does not go away. This information is not intended to replace advice given to you by your health care provider. Make sure you discuss any questions you have with your health care provider. Document Revised: 11/06/2020 Document Reviewed: 11/06/2020 Elsevier Patient Education  [place] is inflammation of the tissue (cartilage) that connects the ribs to the breastbone (sternum). This causes pain in the front of the chest. The pain usually starts slowly and involves more than one rib. What are the causes? The exact cause of this condition is not always known. It results from stress on the cartilage where your ribs attach to your sternum. The cause of this  stress could be: Chest injury. Exercise or activity, such as lifting. Severe coughing. What increases the risk? You are more likely to develop this condition if you: Are male. Are 61-41 years old. Recently started a new exercise or work activity. Have low levels of vitamin D. Have a condition that makes you cough frequently. What are the signs or symptoms? The main symptom of this condition is chest pain. The pain: Usually starts gradually and can be sharp or dull. Gets worse with deep breathing, coughing, or exercise. Gets better with rest. May be worse when you press on the affected area of your ribs and sternum. How is this diagnosed? This condition is diagnosed based on your symptoms, your medical history, and a physical exam. Your health care provider will check for pain when pressing on your sternum. You may also have tests to rule out other causes of chest pain. These may include: A chest X-ray to check for lung problems. An ECG (electrocardiogram) to see if you have a heart problem that could be causing the pain. An imaging scan to rule out a chest or rib fracture. How is this treated? This condition usually goes away on its own over time. Your health  care provider may prescribe an NSAID, such as ibuprofen, to reduce pain and inflammation. Treatment may also include: Resting and avoiding activities that make pain worse. Applying heat or ice to the area to reduce pain and inflammation. Doing exercises to stretch your chest muscles. If these treatments do not help, your health care provider may inject a numbing medicine at the sternum-rib connection to help relieve the pain. Follow these instructions at home: Managing pain, stiffness, and swelling     If directed, put ice on the painful area. To do this: Put ice in a plastic bag. Place a towel between your skin and the bag. Leave the ice on for 20 minutes, 2-3 times a day. If directed, apply heat to the affected area as  often as told by your health care provider. Use the heat source that your health care provider recommends, such as a moist heat pack or a heating pad. Place a towel between your skin and the heat source. Leave the heat on for 20-30 minutes. Remove the heat if your skin turns bright red. This is especially important if you are unable to feel pain, heat, or cold. You may have a greater risk of getting burned. Activity Rest as told by your health care provider. Avoid activities that make pain worse. This includes any activities that use chest, abdominal, and side muscles. Do not lift anything that is heavier than 10 lb (4.5 kg), or the limit that you are told, until your health care provider says that it is safe. Return to your normal activities as told by your health care provider. Ask your health care provider what activities are safe for you. General instructions Take over-the-counter and prescription medicines only as told by your health care provider. Keep all follow-up visits as told by your health care provider. This is important. Contact a health care provider if: You have chills or a fever. Your pain does not go away or it gets worse. You have a cough that does not go away. Get help right away if: You have shortness of breath. You have severe chest pain that is not relieved by medicines, heat, or ice. These symptoms may represent a serious problem that is an emergency. Do not wait to see if the symptoms will go away. Get medical help right away. Call your local emergency services (911 in the [place] Do not drive yourself to the hospital.  Summary Costochondritis is inflammation of the tissue (cartilage) that connects the ribs to the breastbone (sternum). This condition causes pain in the front of the chest. Costochondritis results from stress on the cartilage where your ribs attach to your sternum. Treatment may include medicines, rest, heat or ice, and exercises. This information is not  intended to replace advice given to you by your health care provider. Make sure you discuss any questions you have with your health care provider. Document Revised: 11/10/2021 Document Reviewed: 07/06/2019 Elsevier Patient Education  [place].

## 2022-05-20 ENCOUNTER — Other Ambulatory Visit: Payer: Self-pay | Admitting: Internal Medicine

## 2022-05-20 DIAGNOSIS — E559 Vitamin D deficiency, unspecified: Secondary | ICD-10-CM

## 2022-05-20 LAB — HEMOGLOBIN A1C: Hgb A1c MFr Bld: 6.1 % (ref 4.6–6.5)

## 2022-05-20 MED ORDER — CHOLECALCIFEROL 1.25 MG (50000 UT) PO CAPS: 50000.0000 [IU] | ORAL_CAPSULE | ORAL | 1 refills | Status: DC

## 2022-06-04 ENCOUNTER — Ambulatory Visit: Payer: BC Managed Care – PPO | Admitting: Internal Medicine

## 2022-06-07 ENCOUNTER — Encounter: Payer: Self-pay | Admitting: Internal Medicine

## 2022-06-07 ENCOUNTER — Ambulatory Visit: Payer: Commercial Managed Care - PPO | Admitting: Internal Medicine

## 2022-06-07 VITALS — BP 138/80 | HR 80 | Temp 98.4°F | Ht 69.0 in | Wt 281.6 lb

## 2022-06-07 DIAGNOSIS — I152 Hypertension secondary to endocrine disorders: Secondary | ICD-10-CM | POA: Diagnosis not present

## 2022-06-07 DIAGNOSIS — D72829 Elevated white blood cell count, unspecified: Secondary | ICD-10-CM | POA: Diagnosis not present

## 2022-06-07 DIAGNOSIS — E1159 Type 2 diabetes mellitus with other circulatory complications: Secondary | ICD-10-CM | POA: Diagnosis not present

## 2022-06-07 DIAGNOSIS — E559 Vitamin D deficiency, unspecified: Secondary | ICD-10-CM

## 2022-06-07 NOTE — Progress Notes (Signed)
Chief Complaint  Patient presents with   Follow-up    6 month f/u   F/u  1. Htn with dm 2 controlled sl elevated bp today on norvasc 10 mg qd and hyzaar 100-25 mg qd  2. Elevated WBC refer h/o he does vape cbd   Review of Systems  Constitutional:  Negative for weight loss.  HENT:  Negative for hearing loss.   Eyes:  Negative for blurred vision.  Respiratory:  Negative for shortness of breath.   Cardiovascular:  Negative for chest pain.  Gastrointestinal:  Negative for abdominal pain and blood in stool.  Genitourinary:  Negative for dysuria.  Musculoskeletal:  Negative for falls and joint pain.  Skin:  Negative for rash.  Neurological:  Negative for headaches.  Psychiatric/Behavioral:  Negative for depression.    Past Medical History:  Diagnosis Date   Hypertension    Past Surgical History:  Procedure Laterality Date   COLONOSCOPY WITH PROPOFOL N/A 03/17/2020   Procedure: COLONOSCOPY WITH PROPOFOL;  Surgeon: Toledo, Benay Pike, MD;  Location: [facility] ENDOSCOPY;  Service: Gastroenterology;  Laterality: N/A;   LAPAROSCOPIC GASTRIC BANDING     Dr. Flavia Shipper [facility] 2008    Family History  Problem Relation Age of Onset   Arthritis Mother    Hypertension Mother    Alcohol abuse Father    Diabetes Father    Hypertension Brother    Hypertension Brother    Bipolar disorder Daughter    Social History   Socioeconomic History   Marital status: Married    Spouse name: Not on file   Number of children: Not on file   Years of education: Not on file   Highest education level: Not on file  Occupational History   Not on file  Tobacco Use   Smoking status: Never   Smokeless tobacco: Never  Vaping Use   Vaping Use: Never used  Substance and Sexual Activity   Alcohol use: Not Currently   Drug use: Not Currently   Sexual activity: Yes    Comment: wife   Other Topics Concern   Not on file  Social History Narrative   Wears seat belt, safe in relationship    Associates degree,  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 d3   losartan-hydrochlorothiazide (HYZAAR) 100-25 MG tablet Take 1 tablet by mouth daily. In am   Semaglutide, 2 MG/DOSE, 8 MG/3ML SOPN Inject 2 mg as directed once a week. Dc 1 mg   No Known Allergies Recent Results (from the past 2160 hour(s))  D-Dimer, Quantitative     Status: None   Collection Time: 05/19/22  3:25 PM  Result Value Ref Range   D-Dimer, Quant 0.44 <0.50 mcg/mL FEU    Comment: . The D-Dimer test is used frequently to exclude an acute PE or DVT. In patients with a low to moderate clinical risk assessment and a D-Dimer result <  0.50 mcg/mL FEU, the likelihood of a PE or DVT is very low. However, a thromboembolic event should not be excluded solely on the basis of the D-Dimer level. Increased levels of D-Dimer are associated with a PE, DVT, DIC, malignancies, inflammation, sepsis, surgery, trauma, pregnancy, and advancing patient age. [Jama 2006 11:295(2):199-207] . For additional information, please refer to: http://education.questdiagnostics.com/faq/FAQ149 (This link is being provided for informational/ educational purposes only) .   Vitamin D (25 hydroxy)     Status: Abnormal   Collection Time: 05/19/22  3:25 PM  Result Value Ref Range   VITD 29.63 (L) 30.00 - 100.00 ng/mL  Troponin I (High Sensitivity)     Status: None   Collection Time: 05/19/22  3:25 PM  Result Value Ref Range    High Sens Troponin I 5 2 - 17 ng/L  Hemoglobin A1c     Status: None   Collection Time: 05/19/22  3:25 PM  Result Value Ref Range   Hgb A1c MFr Bld 6.1 4.6 - 6.5 %    Comment: Glycemic Control Guidelines for People with Diabetes:Non Diabetic:  <6%Goal of Therapy: <7%Additional Action Suggested:  >8%   CBC with Differential/Platelet     Status: Abnormal   Collection Time: 05/19/22  3:25 PM  Result Value Ref Range   WBC 11.3 (H) 4.0 - 10.5 K/uL   RBC 5.20 4.22 - 5.81 Mil/uL   Hemoglobin 13.3 13.0 - 17.0 g/dL   HCT 41.2 39.0 - 52.0 %   MCV 79.2 78.0 - 100.0 fl   MCHC 32.3 30.0 - 36.0 g/dL   RDW 16.6 (H) 11.5 - 15.5 %   Platelets 314.0 150.0 - 400.0 K/uL   Neutrophils Relative % 67.9 43.0 - 77.0 %   Lymphocytes Relative 19.1 12.0 - 46.0 %   Monocytes Relative 9.8 3.0 - 12.0 %   Eosinophils Relative 2.4 0.0 - 5.0 %   Basophils Relative 0.8 0.0 - 3.0 %   Neutro Abs 7.7 1.4 - 7.7 K/uL   Lymphs Abs 2.2 0.7 - 4.0 K/uL   Monocytes Absolute 1.1 (H) 0.1 - 1.0 K/uL   Eosinophils Absolute 0.3 0.0 - 0.7 K/uL   Basophils Absolute 0.1 0.0 - 0.1 K/uL  Lipid panel     Status: None   Collection Time: 05/19/22  3:25 PM  Result Value Ref Range   Cholesterol 158 0 - 200 mg/dL    Comment: ATP III Classification       Desirable:  < 200 mg/dL               Borderline High:  200 - 239 mg/dL          High:  > = 240 mg/dL   Triglycerides 60.0 0.0 - 149.0 mg/dL    Comment: Normal:  <150 mg/dLBorderline High:  150 - 199 mg/dL   HDL 51.70 >39.00 mg/dL   VLDL 12.0 0.0 - 40.0 mg/dL   LDL Cholesterol 95 0 - 99 mg/dL   Total CHOL/HDL Ratio 3     Comment:                Men          Women1/2 Average Risk     3.4          3.3Average Risk          5.0          4.42X Average Risk          9.6  7.13X Average Risk          15.0          11.0                       NonHDL 106.64     Comment: NOTE:  Non-HDL goal should be 30 mg/dL higher than patient's LDL goal (i.e. LDL goal of < 70 mg/dL, would have non-HDL  goal of < 100 mg/dL)  Comprehensive metabolic panel     Status: Abnormal   Collection Time: 05/19/22  3:25 PM  Result Value Ref Range   Sodium 140 135 - 145 mEq/L   Potassium 3.3 (L) 3.5 - 5.1 mEq/L   Chloride 103 96 - 112 mEq/L   CO2 27 19 - 32 mEq/L   Glucose, Bld 82 70 - 99 mg/dL   BUN 12 6 - 23 mg/dL   Creatinine, Ser 1.08 0.40 - 1.50 mg/dL   Total Bilirubin 0.8 0.2 - 1.2 mg/dL   Alkaline Phosphatase 73 39 - 117 U/L   AST 23 0 - 37 U/L   ALT 19 0 - 53 U/L   Total Protein 7.5 6.0 - 8.3 g/dL   Albumin 4.5 3.5 - 5.2 g/dL   GFR 78.45 >60.00 mL/min    Comment: Calculated using the CKD-EPI Creatinine Equation (2021)   Calcium 9.7 8.4 - 10.5 mg/dL   Objective  Body mass index is 41.59 kg/m. Wt Readings from Last 3 Encounters:  06/07/22 281 lb 9.6 oz (127.7 kg)  05/19/22 277 lb 3.2 oz (125.7 kg)  12/02/21 (!) 302 lb (137 kg)   Temp Readings from Last 3 Encounters:  06/07/22 98.4 F (36.9 C) (Oral)  05/19/22 99 F (37.2 C) (Oral)  12/02/21 98.3 F (36.8 C) (Oral)   BP Readings from Last 3 Encounters:  06/07/22 138/80  05/19/22 (!) 154/88  12/02/21 132/86   Pulse Readings from Last 3 Encounters:  06/07/22 80  05/19/22 81  12/02/21 89    Physical Exam Vitals and nursing note reviewed.  Constitutional:      Appearance: Normal appearance. He is well-developed and well-groomed.  HENT:     Head: Normocephalic and atraumatic.  Eyes:     Conjunctiva/sclera: Conjunctivae normal.     Pupils: Pupils are equal, round, and reactive to light.  Cardiovascular:     Rate and Rhythm: Normal rate and regular rhythm.     Heart sounds: Normal heart sounds.  Pulmonary:     Effort: Pulmonary effort is normal. No respiratory distress.     Breath sounds: Normal breath sounds.  Abdominal:     Tenderness: There is no abdominal tenderness.  Skin:    General: Skin is warm and moist.  Neurological:     General: No focal deficit present.     Mental Status: He is alert and oriented to  person, place, and time. Mental status is at baseline.     Sensory: Sensation is intact.     Motor: Motor function is intact.     Coordination: Coordination is intact.     Gait: Gait is intact. Gait normal.  Psychiatric:        Attention and Perception: Attention and perception normal.        Mood and Affect: Mood and affect normal.        Speech: Speech normal.        Behavior: Behavior normal. Behavior is cooperative.        Thought Content: Thought content  normal.        Cognition and Memory: Cognition and memory normal.        Judgment: Judgment normal.     Assessment  Plan  Leukocytosis, unspecified type - Plan: Ambulatory referral to Hematology / Oncology  Vitamin D deficiency weekly d3 for now  Hypertension associated with diabetes ([place])  norvasc 10 mg qd and hyzaar 100-25 mg qd  Reduce wt  Healthy diet choices   HM Flu shot not had 2023 sch at pharmacy 06/07/22 covid 4/4 will consider booster in the future today at pharmacy 06/07/22   Tdap 12/02/21  Consider prevnar 20 in the future  Consider shingrix in the future  fasting labs 05/19/22   Colonoscopy 03/17/2020 [facility] tubular FH in 5 years x 2 polyps   PSA 0.19 12/02/21  rec healthy diet and exercise and salt reduction  Hep C neg 08/13/20 declines HIV   [facility]    Provider: Dr. Olivia Mackie McLean-Scocuzza-Internal Medicine

## 2022-06-07 NOTE — Patient Instructions (Addendum)
Dr Flavia Shipper  pt can call to sch [phone] referral was received. ([facility])    Dr. Janese Banks elevated [facility] Phone Fax E-mail Address  [phone] Not available [place] [address]     Specialties     Hematology        Think about covid 19 shot today + flu shot  [facility] exam    Leukocytosis Leukocytosis means that a person has more white blood cells than normal. White blood cells are made in the bone marrow. Bone marrow is the spongy tissue inside bones. The main job of white blood cells is to fight infection. Having too many white blood cells is a common condition. It can develop as a result of many types of medical problems. What are the causes? Leukocytosis may be caused by various conditions. In some cases, the bone marrow is normal but is still making too many white blood cells. This could be the result of: Infection. Injury. Physical stress. Emotional stress. Surgery. Allergic reactions. Certain medicines. Other causes may include: A genetic or inherited disease. Chronic inflammatory conditions. Tumors that start in other areas of the body, but not in the blood or bone marrow. Pregnancy and labor. In other cases, a person may have a bone marrow disorder that is causing the body to make too many white blood cells. Bone marrow disorders include: Leukemia. This is a type of blood cancer. Myeloproliferative disorders. These disorders cause blood cells to grow abnormally. What are the signs or symptoms? Often, this condition causes no symptoms. Some people may have symptoms due to the medical condition that is causing their leukocytosis. These symptoms may include: Bleeding. Bruising. Fever. Night sweats. Weakness. Weight loss. Other symptoms may include: An enlarged spleen. Swollen lymph nodes. Repeated infections. How is this diagnosed? This condition is diagnosed with blood tests. It is often found  when blood is tested as part of a routine physical exam. You may have other tests to help determine why you have too many white blood cells. These tests may include: A complete blood count (CBC). This test measures all the types of blood cells in your body. Chest X-rays, urine tests, or other tests to look for signs of infection. Bone marrow aspiration. For this test, a needle is put into your bone. Cells from the bone marrow are removed through the needle and examined under a microscope. Other tests on the blood or bone marrow sample. CT scan, bone scan, or other imaging tests. How is this treated? Usually, treatment is not needed for leukocytosis. However, if an infection, cancer, bone marrow disorder, or other serious problem is causing your leukocytosis, it will need to be treated. Treatment may include: Regular monitoring of your white blood cell count to look for changes. Antibiotic medicine if you have a bacterial infection. Bone marrow transplant. This treatment replaces your diseased bone marrow with healthy cells that will grow new bone marrow. Chemotherapy or biological therapies such as the use of antibodies. These treatments may be used to kill cancer cells or to decrease the number of white blood cells. Follow these instructions at home: Medicines Take over-the-counter and prescription medicines only as told by your health care provider. If you were prescribed an antibiotic medicine, take it as told by your health care provider. Do not stop taking the antibiotic even if you start to feel better. Eating and drinking  Eat foods that are low in saturated fats and  high in fiber. Eat plenty of fruits and vegetables. Drink enough fluid to keep your urine pale yellow. Limit your intake of caffeine and alcohol. General instructions Maintain a healthy weight. Ask your health care provider what weight is best for you. Do 30 minutes of exercise at least 5 times each week. Check with your  health care provider before you start a new exercise routine. Follow any safety precautions as told by your health care provider. This may be needed if your condition causes an increased risk for infection or bleeding. Do not use any products that contain nicotine or tobacco. These products include cigarettes, chewing tobacco, and vaping devices, such as e-cigarettes. If you need help quitting, ask your health care provider. Keep all follow-up visits. This is important. Contact a health care provider if: You feel weak or more tired than usual. You develop chills, a cough, or nasal congestion. You have a fever. You lose weight without trying. You have night sweats. You bruise easily. You have new or worsening symptoms. Get help right away if: You bleed more than normal or your bleeding is difficult to stop. You have chest pain or trouble breathing. You have nausea or vomiting that does not stop. You feel dizzy or light-headed, or you lose consciousness. These symptoms may be an emergency. Get help right away. Call 911. Do not wait to see if the symptoms will go away. Do not drive yourself to the hospital. Summary Leukocytosis means that a person has more white blood cells than normal. This condition often causes no symptoms. This condition may be caused by various conditions. Keep all follow-up visits. This is important. This information is not intended to replace advice given to you by your health care provider. Make sure you discuss any questions you have with your health care provider. Document Revised: 03/29/2021 Document Reviewed: 03/29/2021 Elsevier Patient Education  [place].

## 2022-07-05 ENCOUNTER — Inpatient Hospital Stay: Payer: Commercial Managed Care - PPO | Attending: Oncology | Admitting: Oncology

## 2022-07-05 ENCOUNTER — Inpatient Hospital Stay: Payer: Commercial Managed Care - PPO

## 2022-07-05 ENCOUNTER — Encounter: Payer: Self-pay | Admitting: Oncology

## 2022-07-05 VITALS — BP 129/81 | HR 74 | Resp 20 | Wt 279.4 lb

## 2022-07-05 DIAGNOSIS — I10 Essential (primary) hypertension: Secondary | ICD-10-CM | POA: Insufficient documentation

## 2022-07-05 DIAGNOSIS — R718 Other abnormality of red blood cells: Secondary | ICD-10-CM | POA: Insufficient documentation

## 2022-07-05 DIAGNOSIS — E1159 Type 2 diabetes mellitus with other circulatory complications: Secondary | ICD-10-CM | POA: Diagnosis not present

## 2022-07-05 DIAGNOSIS — Z9884 Bariatric surgery status: Secondary | ICD-10-CM | POA: Insufficient documentation

## 2022-07-05 DIAGNOSIS — D72828 Other elevated white blood cell count: Secondary | ICD-10-CM | POA: Diagnosis present

## 2022-07-05 DIAGNOSIS — D729 Disorder of white blood cells, unspecified: Secondary | ICD-10-CM

## 2022-07-05 LAB — CBC WITH DIFFERENTIAL/PLATELET
Abs Immature Granulocytes: 0.17 10*3/uL — ABNORMAL HIGH (ref 0.00–0.07)
Basophils Absolute: 0.1 10*3/uL (ref 0.0–0.1)
Basophils Relative: 1 %
Eosinophils Absolute: 0.3 10*3/uL (ref 0.0–0.5)
Eosinophils Relative: 3 %
HCT: 42.7 % (ref 39.0–52.0)
Hemoglobin: 13.9 g/dL (ref 13.0–17.0)
Immature Granulocytes: 2 %
Lymphocytes Relative: 18 %
Lymphs Abs: 2 10*3/uL (ref 0.7–4.0)
MCH: 25.9 pg — ABNORMAL LOW (ref 26.0–34.0)
MCHC: 32.6 g/dL (ref 30.0–36.0)
MCV: 79.5 fL — ABNORMAL LOW (ref 80.0–100.0)
Monocytes Absolute: 1 10*3/uL (ref 0.1–1.0)
Monocytes Relative: 9 %
Neutro Abs: 7.6 10*3/uL (ref 1.7–7.7)
Neutrophils Relative %: 67 %
Platelets: 275 10*3/uL (ref 150–400)
RBC: 5.37 MIL/uL (ref 4.22–5.81)
RDW: 15.3 % (ref 11.5–15.5)
WBC: 11.2 10*3/uL — ABNORMAL HIGH (ref 4.0–10.5)
nRBC: 0 % (ref 0.0–0.2)

## 2022-07-05 LAB — FERRITIN: Ferritin: 27 ng/mL (ref 24–336)

## 2022-07-05 LAB — TECHNOLOGIST SMEAR REVIEW: Plt Morphology: ADEQUATE

## 2022-07-05 LAB — IRON AND TIBC
Iron: 36 ug/dL — ABNORMAL LOW (ref 45–182)
Saturation Ratios: 9 % — ABNORMAL LOW (ref 17.9–39.5)
TIBC: 382 ug/dL (ref 250–450)
UIBC: 346 ug/dL

## 2022-07-05 NOTE — Progress Notes (Signed)
Hematology/Oncology Consult note [facility] Telephone:([phone] [phone]  Patient Care Team: McLean-Scocuzza, Nino Glow, MD as PCP - General (Internal Medicine)   Name of the patient: Darren Perkins  [id number]  04-06-1971    Reason for referral-leukocytosis   Referring physician-Dr. Mclean-Scocuzza  Date of visit: 07/05/22   History of presenting illness-patient is a 51 year old African-American male for failure to situs since.  Patient has had a mildly elevated white cell count fluctuating between 10-11 for the last 3 years.  Hemoglobin and platelets have been normal.  Of note he has had chronic microcytosis as well.  Appetite and weight have remained stable.  Denies any blood loss in his stool or urine.  He has had his last colonoscopy in July 2021 which showed polyps in the splenic flexure that were negative for malignancy.  ECOG PS- 0  Pain scale- 0   Review of systems- Review of Systems  Constitutional:  Negative for chills, fever, malaise/fatigue and weight loss.  HENT:  Negative for congestion, ear discharge and nosebleeds.   Eyes:  Negative for blurred vision.  Respiratory:  Negative for cough, hemoptysis, sputum production, shortness of breath and wheezing.   Cardiovascular:  Negative for chest pain, palpitations, orthopnea and claudication.  Gastrointestinal:  Negative for abdominal pain, blood in stool, constipation, diarrhea, heartburn, melena, nausea and vomiting.  Genitourinary:  Negative for dysuria, flank pain, frequency, hematuria and urgency.  Musculoskeletal:  Negative for back pain, joint pain and myalgias.  Skin:  Negative for rash.  Neurological:  Negative for dizziness, tingling, focal weakness, seizures, weakness and headaches.  Endo/Heme/Allergies:  Does not bruise/bleed easily.  Psychiatric/Behavioral:  Negative for depression and suicidal ideas. The patient does not have insomnia.     No Known  Allergies  Patient Active Problem List   Diagnosis Date Noted   BMI 40.0-44.9, adult ([place]) 01/29/2022   BMI 45.0-49.9, adult ([place]) 12/28/2021   Hypertension associated with diabetes ([place]) 12/02/2021   Effusion of left knee 12/22/2020   Morbid obesity with BMI of 40.0-44.9, adult ([place]) 08/13/2020   Annual physical exam 08/13/2020   Arthritis of left knee 08/13/2020   Vitamin D deficiency 11/12/2019   Prediabetes 11/12/2019   GERD (gastroesophageal reflux disease) 10/03/2018   Hiatal hernia 10/03/2018   Morbid obesity ([place]) 01/11/2018   Anxiety and depression 12/14/2017   HTN (hypertension) 12/07/2017   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 Flavia Shipper [facility] 2008     Social History   Socioeconomic History   Marital status: Married    Spouse name: Not on file   Number of children: Not on file   Years of education: Not on file   Highest education level: Not on file  Occupational History   Not on file  Tobacco Use   Smoking status: Never   Smokeless tobacco: Never  Vaping Use   Vaping Use: Never used  Substance and Sexual Activity   Alcohol use: Not Currently   Drug use: Not Currently   Sexual activity: Yes    Comment: wife   Other Topics Concern   Not  on file  Social History Narrative   Wears seat belt, safe in relationship    Associates degree, 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    Mammie Russian    Son and daughter    Social Determinants of Radio broadcast assistant Strain: Not on file  Food Insecurity: Not on file  Transportation Needs:  Not on file  Physical Activity: Not on file  Stress: Not on file  Social Connections: Not on file  Intimate Partner Violence: Not on file     Family History  Problem Relation Age of Onset   Cancer Mother    Arthritis Mother    Hypertension Mother    Alcohol abuse Father    Diabetes Father    Cancer Brother    Hypertension Brother    Hypertension Brother    Bipolar disorder Daughter      Current Outpatient Medications:    amLODipine (NORVASC) 10 MG tablet, TAKE 1 TABLET(10 MG) BY MOUTH DAILY, Disp: 90 tablet, Rfl: 3   Cholecalciferol (VITAMIN D3) 1.25 MG (50000 UT) CAPS, Take by mouth., Disp: , Rfl:    losartan-hydrochlorothiazide (HYZAAR) 100-25 MG tablet, Take 1 tablet by mouth daily. In am, Disp: 90 tablet, Rfl: 3   Semaglutide, 2 MG/DOSE, 8 MG/3ML SOPN, Inject 2 mg as directed once a week. Dc 1 mg, Disp: 9 mL, Rfl: 3   Physical exam:  Vitals:   07/05/22 0946  BP: 129/81  Pulse: 74  Resp: 20  SpO2: 97%  Weight: 279 lb 6.4 oz (126.7 kg)   Physical Exam Constitutional:      General: He is not in acute distress. Cardiovascular:     Rate and Rhythm: Normal rate and regular rhythm.     Heart sounds: Normal heart sounds.  Pulmonary:     Effort: Pulmonary effort is normal.     Breath sounds: Normal breath sounds.  Abdominal:     General: Bowel sounds are normal.     Palpations: Abdomen is soft.  Lymphadenopathy:     Comments: No palpable cervical, supraclavicular, axillary or inguinal adenopathy    Skin:    General: Skin is warm and dry.  Neurological:     Mental Status: He is alert and oriented to person, place, and time.           Latest Ref Rng & Units 05/19/2022    3:25 PM  CMP  Glucose 70 - 99 mg/dL 82   BUN 6 - 23 mg/dL 12   Creatinine 0.40 - 1.50 mg/dL 1.08   Sodium 135 - 145 mEq/L 140   Potassium 3.5 - 5.1 mEq/L 3.3   Chloride 96 - 112 mEq/L 103   CO2 19 - 32 mEq/L 27   Calcium 8.4 - 10.5 mg/dL 9.7   Total Protein 6.0 - 8.3 g/dL 7.5   Total  Bilirubin 0.2 - 1.2 mg/dL 0.8   Alkaline Phos 39 - 117 U/L 73   AST 0 - 37 U/L 23   ALT 0 - 53 U/L 19       Latest Ref Rng & Units 07/05/2022   10:23 AM  CBC  WBC 4.0 - 10.5 K/uL 11.2   Hemoglobin 13.0 - 17.0 g/dL 13.9   Hematocrit 39.0 - 52.0 % 42.7   Platelets 150 - 400 K/uL 275     No images are attached to the encounter.  No results found.  Assessment and plan- Patient is a 51 y.o. male referred for  leukocytosis  Leukocytosis: This has been chronic at least for the last 3 years with differential that mainly showed neutrophilia I suspect this is reactive and in the absence of a clear rising white cell count patient does not require flow cytometry or bone marrow biopsy at this time.  I will be checking a CBC with differential and smear review today.  I will continue to monitor his white cell count slightly every 6 months.  I noticed that patient has microcytosis for the last 2 to 3 years.  And we will be checking his iron studies today.  He does have a history of gastric bypass surgery lap band procedure.  I will see him back in 2-3Weeks time for a virtual visit to discuss results of blood work   Thank you for this kind referral and the opportunity to participate in the care of this patient   Visit Diagnosis 1. Neutrophilia   2. Microcytosis     Dr. Randa Evens, MD, MPH [facility] [phone] 07/05/2022

## 2022-07-09 IMAGING — CR DG KNEE COMPLETE 4+V*L*
4 series · 4 of 4 positions shown · non-contrast
Comparison: None.

CLINICAL DATA: Lower leg pain

EXAM:
LEFT KNEE - COMPLETE 4+ VIEW

[knee ap]
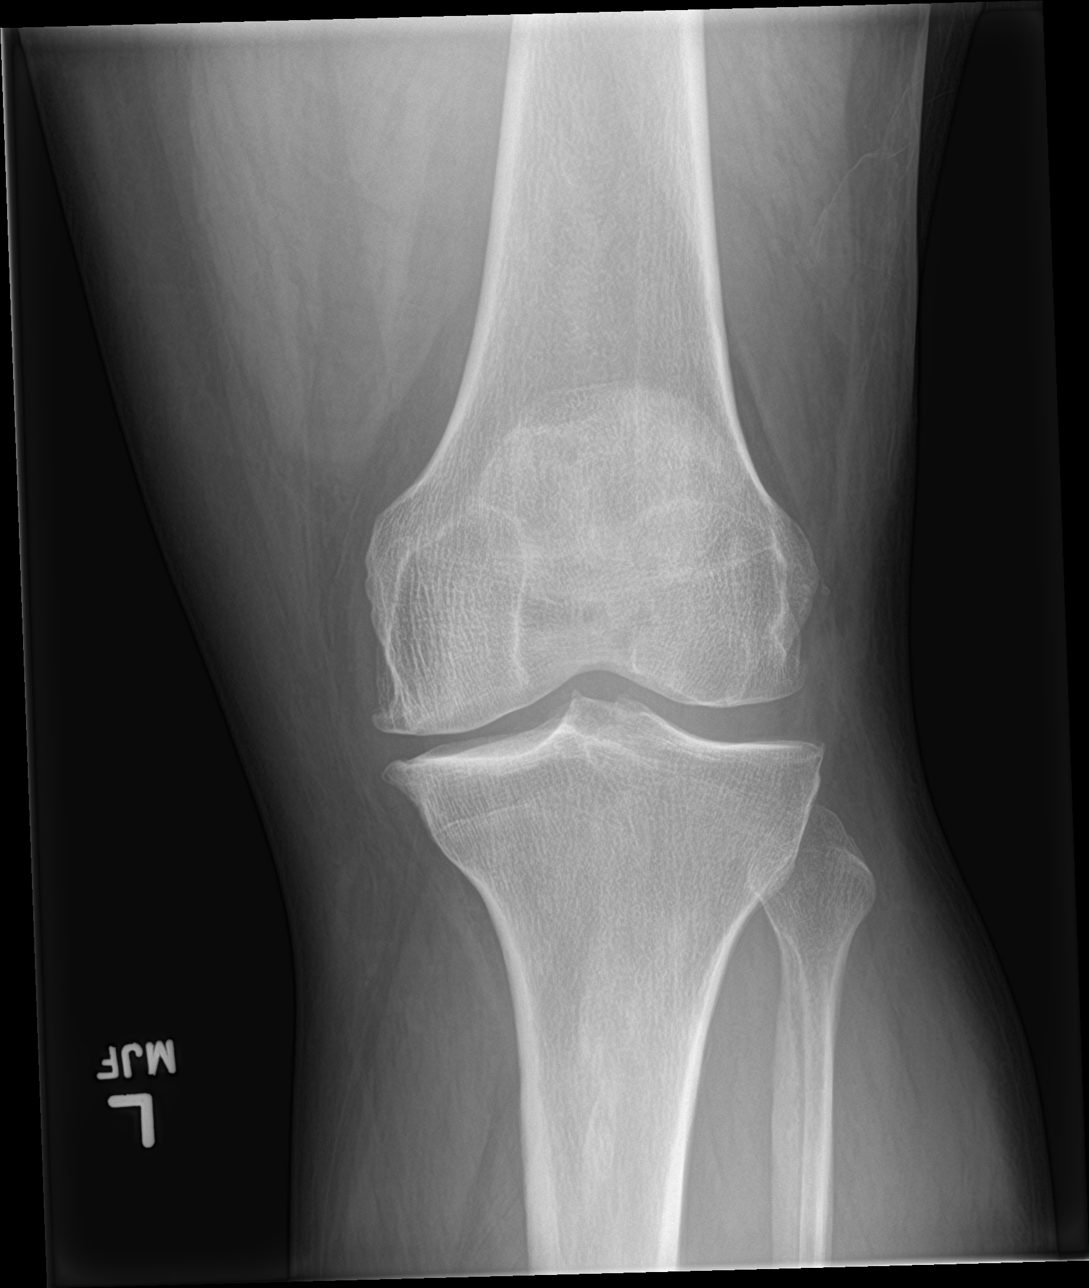

[knee obl (1 of 2)]
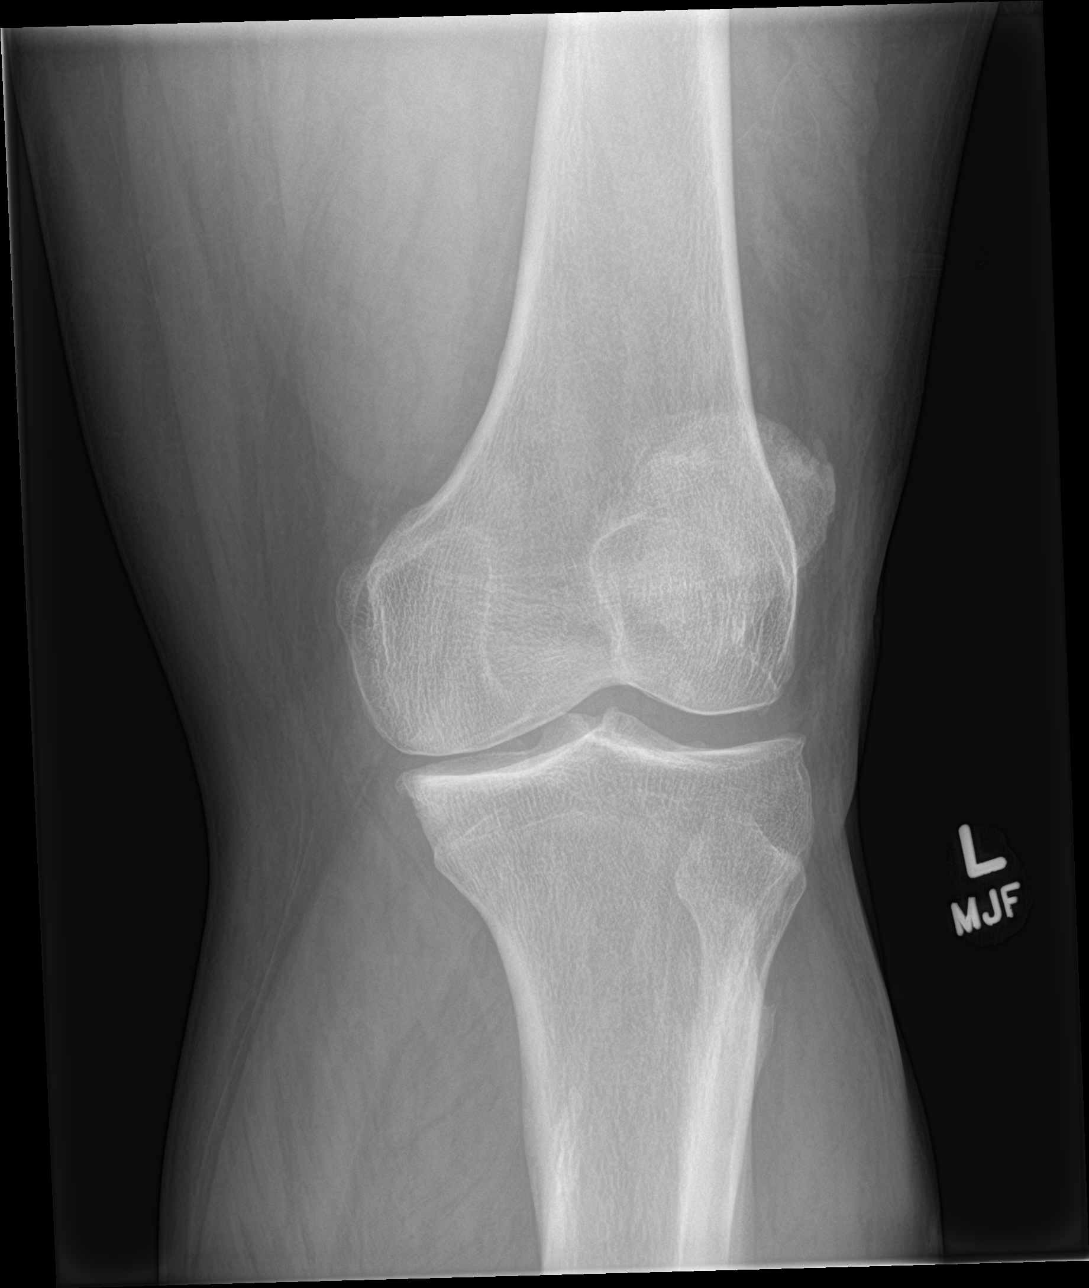

[knee obl (2 of 2)]
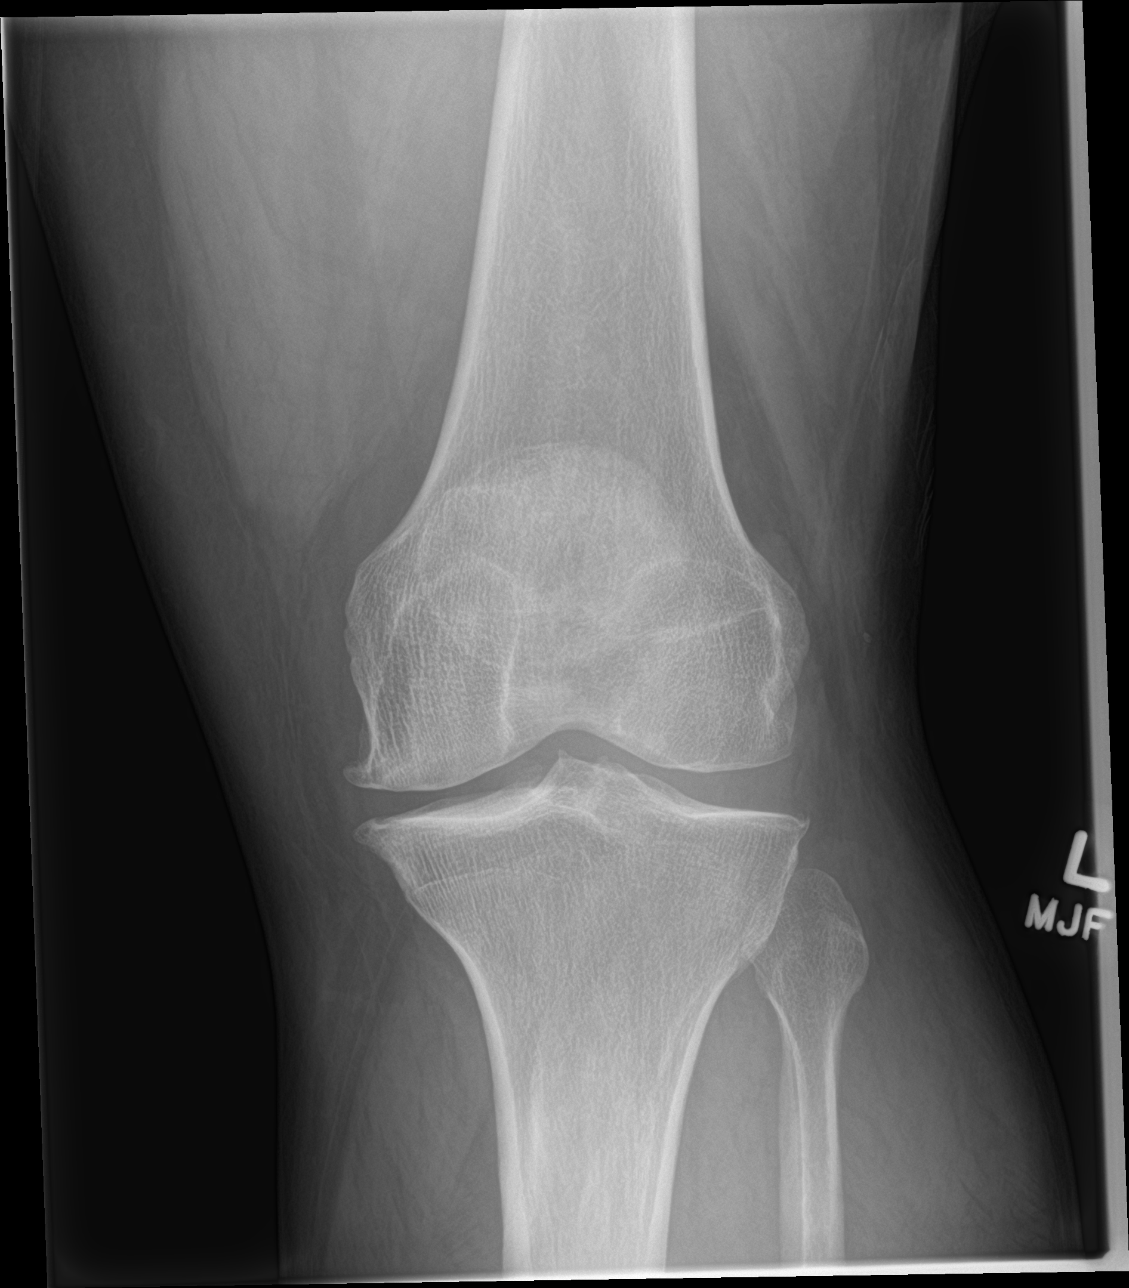

[knee lat]
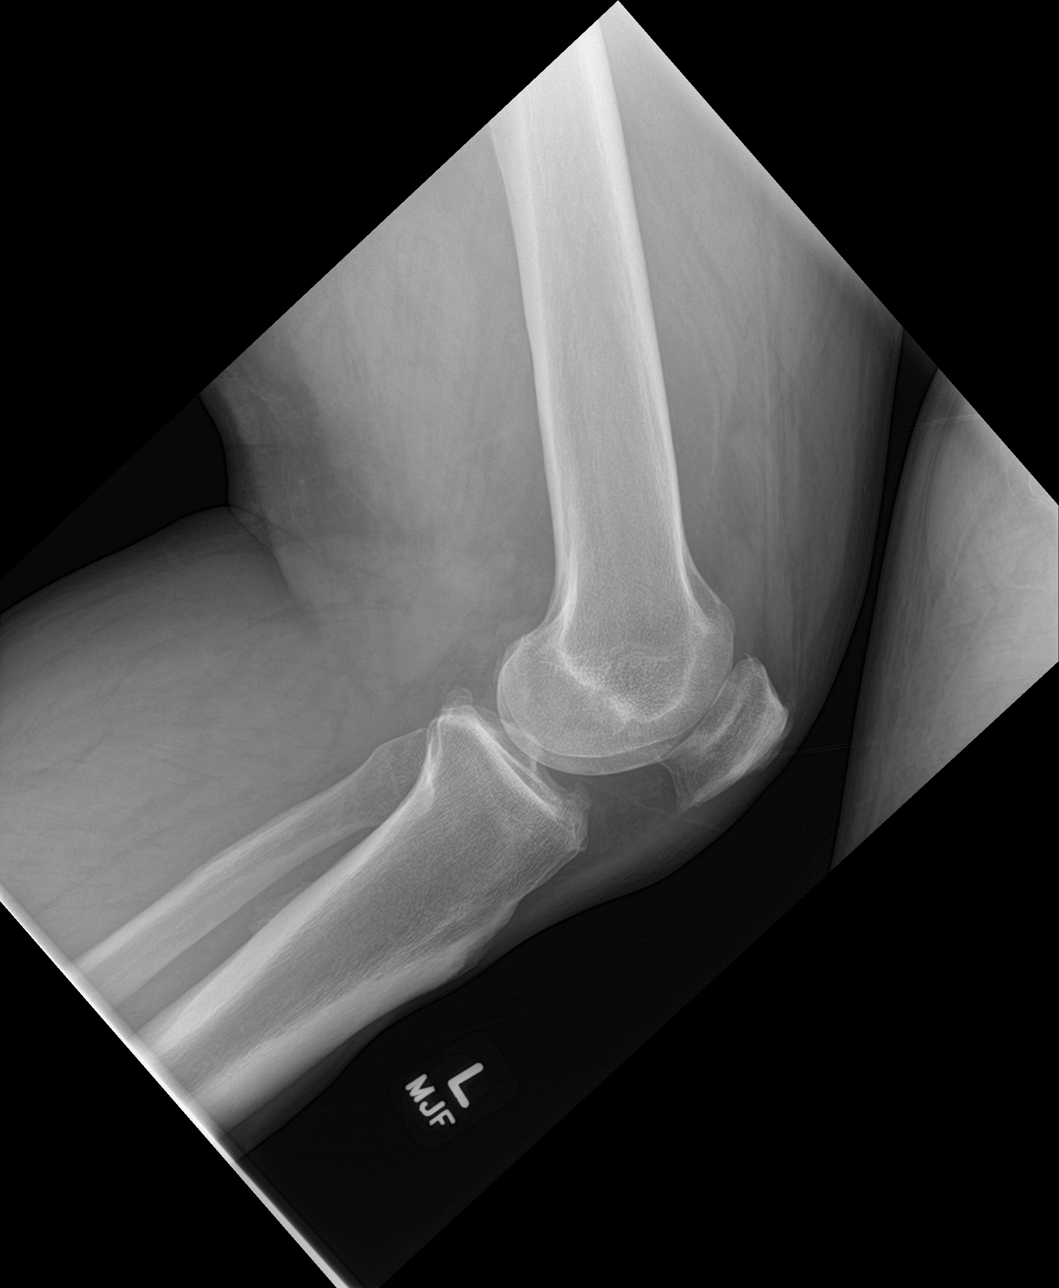

[4 of 4 positions shown; findings below may reference images not displayed]

FINDINGS: No fracture or malalignment. Small knee effusion. Mild
tricompartment arthritis.
IMPRESSION: Mild tricompartment arthritis with small knee effusion.

## 2022-07-21 ENCOUNTER — Inpatient Hospital Stay: Payer: Commercial Managed Care - PPO | Admitting: Oncology

## 2022-07-21 ENCOUNTER — Telehealth: Payer: Self-pay

## 2022-07-21 NOTE — Telephone Encounter (Signed)
Reached out to pt regarding virtual visit today with Dr. Janese Banks at 3:15pm for a new pt f/u. Pt states he does not think he needs the appointment today and he is at work so will not like to do the virtual. Asked the pt if he will like to r/s and pt stated he did not think he needed to. I made the pt aware to please call us if he needs anything and he thanked me for reaching out.

## 2022-09-01 ENCOUNTER — Telehealth: Payer: Self-pay | Admitting: Internal Medicine

## 2022-09-01 DIAGNOSIS — I10 Essential (primary) hypertension: Secondary | ICD-10-CM

## 2022-09-01 DIAGNOSIS — E1159 Type 2 diabetes mellitus with other circulatory complications: Secondary | ICD-10-CM

## 2022-09-01 DIAGNOSIS — Z6841 Body Mass Index (BMI) 40.0 and over, adult: Secondary | ICD-10-CM

## 2022-09-01 MED ORDER — AMLODIPINE BESYLATE 10 MG PO TABS: ORAL_TABLET | ORAL | 3 refills | Status: DC

## 2022-09-01 MED ORDER — SEMAGLUTIDE (2 MG/DOSE) 8 MG/3ML ~~LOC~~ SOPN: 2.0000 mg | PEN_INJECTOR | SUBCUTANEOUS | 3 refills | Status: DC

## 2022-09-01 MED ORDER — LOSARTAN POTASSIUM-HCTZ 100-25 MG PO TABS: 1.0000 | ORAL_TABLET | Freq: Every day | ORAL | 3 refills | Status: DC

## 2022-09-01 NOTE — Telephone Encounter (Signed)
Prescription Request  09/01/2022  Is this a "Controlled Substance" medicine? No  LOV: 06/07/2022  What is the name of the medication or equipment? losartan-hydrochlorothiazide (HYZAAR) 100-25 MG tablet, amLODipine (NORVASC) 10 MG tablet, and Cholecalciferol (VITAMIN D3) 1.25 MG (50000 UT) CAPS, and Ozempic.  Have you contacted your pharmacy to request a refill? No   Which pharmacy would you like this sent to?  [facility] [place], [address] [place] [address] Phone: [phone] Fax: [phone]   Patient notified that their request is being sent to the clinical staff for review and that they should receive a response within 2 business days.   Please advise at Mobile [phone] (mobile)

## 2022-09-01 NOTE — Telephone Encounter (Signed)
I refilled everything but the Vitamin D3 as that is a historical med and I believe pt would need labs. Pt has a TOC with Volanda Napoleon on 12-07-22 four months have been sent for the other meds.

## 2022-12-07 ENCOUNTER — Encounter: Payer: Commercial Managed Care - PPO | Admitting: Family Medicine

## 2022-12-08 NOTE — Progress Notes (Unsigned)
Darren Morrow, NP-C Phone: [phone]  Darren Perkins is a 52 y.o. male who presents today for transfer of care. He would like to change his Ozempic to a different medication as he feels like it is no longer working.  He reports ankle pain that started 2 weeks ago. It is intermittent. The pain is in the back of his ankle, along his achilles tendon. It only occurs intermittently with weightbearing and walking. Denies any new injury or fall. Denies swelling. Denies bruising.   HYPERTENSION Disease Monitoring Home BP Monitoring-120-130/70-80 Chest pain- No    Dyspnea- No Medications Compliance-  Norvasc and Losartan/HCTZ. Lightheadedness-  No  Edema- No BMET    Component Value Date/Time   NA 140 05/19/2022 1525   K 3.3 (L) 05/19/2022 1525   CL 103 05/19/2022 1525   CO2 27 05/19/2022 1525   GLUCOSE 82 05/19/2022 1525   BUN 12 05/19/2022 1525   CREATININE 1.08 05/19/2022 1525   CALCIUM 9.7 05/19/2022 1525   DIABETES Disease Monitoring: Blood Sugar ranges- Not checking Polyuria/phagia/dipsia- No      Optho- Yes Medications: Compliance- Ozempic Hypoglycemic symptoms- No Lab Results  Component Value Date   HGBA1C 6.1 05/19/2022   Anxiety/Depression- Reports doing well. He is not on any medications. His symptoms are well managed at this time. Denies SI/HI. He is not currently seeing a therapist/counselor.  Social History   Tobacco Use  Smoking Status Never  Smokeless Tobacco Never    Current Outpatient Medications on File Prior to Visit  Medication Sig Dispense Refill   Cholecalciferol (VITAMIN D3) 1.25 MG (50000 UT) CAPS Take by mouth.     No current facility-administered medications on file prior to visit.    ROS see history of present illness  Objective  Physical Exam Vitals:   12/09/22 0851 12/09/22 0918  BP: 138/82 130/70  Pulse: 75   Temp: 98.3 F (36.8 C)   SpO2: 95%     BP Readings from Last 3 Encounters:  12/09/22 130/70  07/05/22 129/81  06/07/22  138/80   Wt Readings from Last 3 Encounters:  12/09/22 282 lb 6.4 oz (128.1 kg)  07/05/22 279 lb 6.4 oz (126.7 kg)  06/07/22 281 lb 9.6 oz (127.7 kg)    Physical Exam Constitutional:      General: He is not in acute distress.    Appearance: Normal appearance.  HENT:     Head: Normocephalic.  Cardiovascular:     Rate and Rhythm: Normal rate and regular rhythm.     Heart sounds: Normal heart sounds.  Pulmonary:     Effort: Pulmonary effort is normal.     Breath sounds: Normal breath sounds.  Musculoskeletal:     Right ankle: No swelling. No tenderness. Normal range of motion.     Right Achilles Tendon: Tenderness present.     Left ankle: Normal.     Left Achilles Tendon: Normal.  Skin:    General: Skin is warm and dry.  Neurological:     General: No focal deficit present.     Mental Status: He is alert.  Psychiatric:        Mood and Affect: Mood normal.        Behavior: Behavior normal.    Assessment/Plan: Please see individual problem list.  Achilles tendinitis of right lower extremity Assessment & Plan: Symptoms consistent with tendonitis. Mild tenderness present on exam at Achilles. Intermittent pain only with weightbearing. Encouraged rest, ice, and elevation. Information and education provided to patient. Will refer  to Ortho if symptoms persist or are worsening. Will continue to monitor.    Hypertension associated with diabetes Assessment & Plan: Chronic. Stable on Norvasc and Lostartan-HCTZ daily. Continue. Refills sent. Encouraged patient to continue checking blood pressure occasionally at home. Will monitor.   Orders: -     Comprehensive metabolic panel -     CBC with Differential/Platelet -     amLODIPine Besylate; Take 1 tablet (10 mg total) by mouth daily.  Dispense: 90 tablet; Refill: 3 -     Losartan Potassium-HCTZ; Take 1 tablet by mouth daily. In am  Dispense: 90 tablet; Refill: 3  Type 2 diabetes mellitus without complication, without long-term  current use of insulin Assessment & Plan: Chronic. Stable. Last A1c- 6.1. Will stop Ozempic and start patient on Mounjaro 2.5 mg weekly. A1c today. Encouraged healthy diet and exercise.  Orders: -     Hemoglobin A1c -     Tirzepatide; Inject 2.5 mg into the skin once a week.  Dispense: 2 mL; Refill: 0 -     Tirzepatide; Inject 5 mg into the skin once a week.  Dispense: 2 mL; Refill: 0 -     Tirzepatide; Inject 7.5 mg into the skin once a week.  Dispense: 2 mL; Refill: 0  Anxiety and depression Assessment & Plan: Symptoms well managed at this time. Not taking any medications. Denies SI/HI. Encouraged patient to contact if symptoms are changing or worsening. Will continue to monitor.    Vitamin D deficiency Assessment & Plan: Chronic. Taking supplement. Continue. Will check Vitamin D level today.   Orders: -     VITAMIN D 25 Hydroxy (Vit-D Deficiency, Fractures)  Thyroid disorder screen -     TSH  Lipid screening -     Lipid panel  Screening PSA (prostate specific antigen) -     PSA   Return in about 3 months (around 03/10/2023) for Follow up on diabetes.   Darren Morrow, NP-C [place]

## 2022-12-09 ENCOUNTER — Ambulatory Visit: Payer: Commercial Managed Care - PPO | Admitting: Nurse Practitioner

## 2022-12-09 ENCOUNTER — Other Ambulatory Visit (HOSPITAL_COMMUNITY): Payer: Self-pay

## 2022-12-09 ENCOUNTER — Encounter: Payer: Self-pay | Admitting: Nurse Practitioner

## 2022-12-09 VITALS — BP 130/70 | HR 75 | Temp 98.3°F | Ht 69.0 in | Wt 282.4 lb

## 2022-12-09 DIAGNOSIS — E119 Type 2 diabetes mellitus without complications: Secondary | ICD-10-CM

## 2022-12-09 DIAGNOSIS — E1159 Type 2 diabetes mellitus with other circulatory complications: Secondary | ICD-10-CM | POA: Diagnosis not present

## 2022-12-09 DIAGNOSIS — M7661 Achilles tendinitis, right leg: Secondary | ICD-10-CM

## 2022-12-09 DIAGNOSIS — Z1329 Encounter for screening for other suspected endocrine disorder: Secondary | ICD-10-CM | POA: Diagnosis not present

## 2022-12-09 DIAGNOSIS — R7303 Prediabetes: Secondary | ICD-10-CM

## 2022-12-09 DIAGNOSIS — E559 Vitamin D deficiency, unspecified: Secondary | ICD-10-CM | POA: Diagnosis not present

## 2022-12-09 DIAGNOSIS — F419 Anxiety disorder, unspecified: Secondary | ICD-10-CM

## 2022-12-09 DIAGNOSIS — I152 Hypertension secondary to endocrine disorders: Secondary | ICD-10-CM | POA: Diagnosis not present

## 2022-12-09 DIAGNOSIS — Z125 Encounter for screening for malignant neoplasm of prostate: Secondary | ICD-10-CM | POA: Diagnosis not present

## 2022-12-09 DIAGNOSIS — Z13220 Encounter for screening for lipoid disorders: Secondary | ICD-10-CM

## 2022-12-09 DIAGNOSIS — F32A Depression, unspecified: Secondary | ICD-10-CM

## 2022-12-09 DIAGNOSIS — K219 Gastro-esophageal reflux disease without esophagitis: Secondary | ICD-10-CM

## 2022-12-09 LAB — CBC WITH DIFFERENTIAL/PLATELET
Basophils Absolute: 0.1 10*3/uL (ref 0.0–0.1)
Basophils Relative: 0.7 % (ref 0.0–3.0)
Eosinophils Absolute: 0.3 10*3/uL (ref 0.0–0.7)
Eosinophils Relative: 2.9 % (ref 0.0–5.0)
HCT: 42.3 % (ref 39.0–52.0)
Hemoglobin: 13.9 g/dL (ref 13.0–17.0)
Lymphocytes Relative: 19.1 % (ref 12.0–46.0)
Lymphs Abs: 1.9 10*3/uL (ref 0.7–4.0)
MCHC: 33 g/dL (ref 30.0–36.0)
MCV: 79.2 fl (ref 78.0–100.0)
Monocytes Absolute: 0.9 10*3/uL (ref 0.1–1.0)
Monocytes Relative: 9.6 % (ref 3.0–12.0)
Neutro Abs: 6.6 10*3/uL (ref 1.4–7.7)
Neutrophils Relative %: 67.7 % (ref 43.0–77.0)
Platelets: 315 10*3/uL (ref 150.0–400.0)
RBC: 5.33 Mil/uL (ref 4.22–5.81)
RDW: 16.8 % — ABNORMAL HIGH (ref 11.5–15.5)
WBC: 9.8 10*3/uL (ref 4.0–10.5)

## 2022-12-09 LAB — LIPID PANEL
Cholesterol: 155 mg/dL (ref 0–200)
HDL: 59 mg/dL (ref >39.00)
LDL Cholesterol: 87 mg/dL (ref 0–99)
NonHDL: 95.53
Total CHOL/HDL Ratio: 3
Triglycerides: 41 mg/dL (ref 0.0–149.0)
VLDL: 8.2 mg/dL (ref 0.0–40.0)

## 2022-12-09 LAB — VITAMIN D 25 HYDROXY (VIT D DEFICIENCY, FRACTURES): VITD: 50.41 ng/mL (ref 30.00–100.00)

## 2022-12-09 LAB — COMPREHENSIVE METABOLIC PANEL
ALT: 16 U/L (ref 0–53)
AST: 16 U/L (ref 0–37)
Albumin: 4.6 g/dL (ref 3.5–5.2)
Alkaline Phosphatase: 76 U/L (ref 39–117)
BUN: 10 mg/dL (ref 6–23)
CO2: 29 mEq/L (ref 19–32)
Calcium: 9.6 mg/dL (ref 8.4–10.5)
Chloride: 103 mEq/L (ref 96–112)
Creatinine, Ser: 1 mg/dL (ref 0.40–1.50)
GFR: 85.71 mL/min (ref >60.00)
Glucose, Bld: 85 mg/dL (ref 70–99)
Potassium: 4.1 mEq/L (ref 3.5–5.1)
Sodium: 142 mEq/L (ref 135–145)
Total Bilirubin: 0.5 mg/dL (ref 0.2–1.2)
Total Protein: 6.8 g/dL (ref 6.0–8.3)

## 2022-12-09 LAB — TSH: TSH: 1.03 u[IU]/mL (ref 0.35–5.50)

## 2022-12-09 LAB — PSA: PSA: 0.3 ng/mL (ref 0.10–4.00)

## 2022-12-09 LAB — HEMOGLOBIN A1C: Hgb A1c MFr Bld: 6 % (ref 4.6–6.5)

## 2022-12-09 MED ORDER — TIRZEPATIDE 2.5 MG/0.5ML ~~LOC~~ SOAJ
2.5000 mg | SUBCUTANEOUS | 0 refills | Status: DC
Start: 2022-12-09 — End: 2022-12-10

## 2022-12-09 MED ORDER — LOSARTAN POTASSIUM-HCTZ 100-25 MG PO TABS
1.0000 | ORAL_TABLET | Freq: Every day | ORAL | 3 refills | Status: DC
Start: 2022-12-09 — End: 2024-01-17

## 2022-12-09 MED ORDER — TIRZEPATIDE 5 MG/0.5ML ~~LOC~~ SOAJ
5.0000 mg | SUBCUTANEOUS | 0 refills | Status: DC
Start: 2022-12-09 — End: 2023-01-05

## 2022-12-09 MED ORDER — AMLODIPINE BESYLATE 10 MG PO TABS
10.0000 mg | ORAL_TABLET | Freq: Every day | ORAL | 3 refills | Status: DC
Start: 2022-12-09 — End: 2024-02-02

## 2022-12-09 MED ORDER — TIRZEPATIDE 7.5 MG/0.5ML ~~LOC~~ SOAJ
7.5000 mg | SUBCUTANEOUS | 0 refills | Status: DC
Start: 2022-12-09 — End: 2023-02-21

## 2022-12-09 NOTE — Assessment & Plan Note (Signed)
Chronic. Stable. Last A1c- 6.1. Will stop Ozempic and start patient on Mounjaro 2.5 mg weekly. A1c today. Encouraged healthy diet and exercise.

## 2022-12-09 NOTE — Assessment & Plan Note (Signed)
Symptoms well managed at this time. Not taking any medications. Denies SI/HI. Encouraged patient to contact if symptoms are changing or worsening. Will continue to monitor.

## 2022-12-09 NOTE — Assessment & Plan Note (Signed)
Chronic. Stable on Norvasc and Lostartan-HCTZ daily. Continue. Refills sent. Encouraged patient to continue checking blood pressure occasionally at home. Will monitor.

## 2022-12-09 NOTE — Assessment & Plan Note (Addendum)
Symptoms consistent with tendonitis. Mild tenderness present on exam at Achilles. Intermittent pain only with weightbearing. Encouraged rest, ice, and elevation. Information and education provided to patient. Will refer to Ortho if symptoms persist or are worsening. Will continue to monitor.

## 2022-12-09 NOTE — Assessment & Plan Note (Signed)
Chronic. Taking supplement. Continue. Will check Vitamin D level today.

## 2022-12-10 ENCOUNTER — Other Ambulatory Visit (HOSPITAL_COMMUNITY): Payer: Self-pay

## 2022-12-10 ENCOUNTER — Telehealth: Payer: Self-pay | Admitting: Nurse Practitioner

## 2022-12-10 DIAGNOSIS — I152 Hypertension secondary to endocrine disorders: Secondary | ICD-10-CM

## 2022-12-10 DIAGNOSIS — E119 Type 2 diabetes mellitus without complications: Secondary | ICD-10-CM

## 2022-12-10 MED ORDER — TIRZEPATIDE 2.5 MG/0.5ML ~~LOC~~ SOAJ
2.5000 mg | SUBCUTANEOUS | 0 refills | Status: DC
Start: 2022-12-10 — End: 2023-01-01

## 2022-12-10 NOTE — Telephone Encounter (Signed)
Patient called and will need a Prior Auth for tirzepatide [facility]) 2.5 MG/0.5ML Pen . His pharmacy did not have, [facility] [place].

## 2022-12-11 ENCOUNTER — Encounter: Payer: Self-pay | Admitting: Nurse Practitioner

## 2022-12-13 ENCOUNTER — Other Ambulatory Visit (HOSPITAL_COMMUNITY): Payer: Self-pay

## 2022-12-13 ENCOUNTER — Telehealth: Payer: Self-pay

## 2022-12-13 NOTE — Telephone Encounter (Signed)
Patient Advocate Encounter   Received notification from [employer] that prior authorization is required for [place] 5 MG/0.5 ML PEN through [employer]  Submitted: 12-13-2022 Prior Auth (EOC) ID: [id number]     Status is pending

## 2022-12-14 NOTE — Telephone Encounter (Signed)
Patient Advocate Encounter  Prior Authorization for  [facility] 5 MG/0.5 ML PEN has been approved through [employer].    Prior Auth (EOC) ID:   [id number]   Effective: 12-13-2022 to 12-12-2023

## 2022-12-14 NOTE — Telephone Encounter (Signed)
Called Patient to let him know the PA was approved

## 2023-01-01 ENCOUNTER — Other Ambulatory Visit: Payer: Self-pay | Admitting: Nurse Practitioner

## 2023-01-01 DIAGNOSIS — E119 Type 2 diabetes mellitus without complications: Secondary | ICD-10-CM

## 2023-01-03 MED ORDER — TIRZEPATIDE 2.5 MG/0.5ML ~~LOC~~ SOAJ
2.5000 mg | SUBCUTANEOUS | 0 refills | Status: DC
Start: 2023-01-03 — End: 2023-02-21

## 2023-01-05 ENCOUNTER — Other Ambulatory Visit: Payer: Self-pay | Admitting: Nurse Practitioner

## 2023-01-05 ENCOUNTER — Telehealth: Payer: Self-pay | Admitting: Family Medicine

## 2023-01-05 DIAGNOSIS — E119 Type 2 diabetes mellitus without complications: Secondary | ICD-10-CM

## 2023-01-05 MED ORDER — TIRZEPATIDE 5 MG/0.5ML ~~LOC~~ SOAJ: 5.0000 mg | SUBCUTANEOUS | 0 refills | Status: DC

## 2023-01-05 NOTE — Telephone Encounter (Signed)
Patient called and was checking on his Prior Auth for his tirzepatide [facility]) 5 MG/0.5ML Pen . Please call him

## 2023-01-06 ENCOUNTER — Other Ambulatory Visit (HOSPITAL_COMMUNITY): Payer: Self-pay

## 2023-01-06 NOTE — Telephone Encounter (Addendum)
Per previous note, Mounjaro approved and patient is aware.

## 2023-01-11 ENCOUNTER — Other Ambulatory Visit (HOSPITAL_COMMUNITY): Payer: Self-pay

## 2023-01-11 ENCOUNTER — Telehealth: Payer: Self-pay

## 2023-01-11 NOTE — Telephone Encounter (Signed)
Prescription Request  01/11/2023  LOV: Visit date not found  What is the name of the medication or equipment?  tirzepatide [facility]) 5 MG/0.5ML Pen    Have you contacted your pharmacy to request a refill? Yes   Which pharmacy would you like this sent to?  [id number] [place], [place] - [address] AT [facility] OF [address] [place] [address] Phone: [phone] Fax: [phone]    Patient notified that their request is being sent to the clinical staff for review and that they should receive a response within 2 business days.   Please advise at Mobile [phone] (mobile)  Patient states he has been unable to find a pharmacy that has this medication in the 5 MG dose in stock.  Patient would like to know if he should take two of the 2.5 MG shots?  [facility] [place] [facility] and he will be glad to go there to get it if this is the solution Bethanie Dicker, NP, recommends.  Patient states he would need to have a new prescription if we go this route, otherwise this medication will run out.  Patient states he usually takes his shot on Wednesdays and he does not have a shot for tomorrow.

## 2023-01-11 NOTE — Telephone Encounter (Signed)
Per test claim, higher doses are also covered. No PA submitted at this time.

## 2023-01-11 NOTE — Telephone Encounter (Signed)
Pt notified via mychart

## 2023-01-11 NOTE — Telephone Encounter (Signed)
Patient requests a call back, please.

## 2023-01-12 ENCOUNTER — Other Ambulatory Visit: Payer: Self-pay

## 2023-01-12 MED ORDER — TIRZEPATIDE 5 MG/0.5ML ~~LOC~~ SOAJ
5.0000 mg | SUBCUTANEOUS | 0 refills | Status: DC
  Filled 2023-01-12: qty 2, 28d supply, fill #0

## 2023-01-13 ENCOUNTER — Other Ambulatory Visit: Payer: Self-pay

## 2023-02-19 ENCOUNTER — Other Ambulatory Visit: Payer: Self-pay | Admitting: Nurse Practitioner

## 2023-02-21 ENCOUNTER — Other Ambulatory Visit: Payer: Self-pay

## 2023-02-21 MED ORDER — TIRZEPATIDE 7.5 MG/0.5ML ~~LOC~~ SOAJ
7.5000 mg | SUBCUTANEOUS | 0 refills | Status: DC
  Filled 2023-02-21: qty 2, 28d supply, fill #0

## 2023-02-21 NOTE — Telephone Encounter (Signed)
Called and spoke with pt to confirm his current dose of the medication which is 5 mg. I asked if he was ready to increase to the 735 mg dosage and he confirmed he is and confirmed he would like it to go to [facility]

## 2023-02-22 ENCOUNTER — Other Ambulatory Visit: Payer: Self-pay

## 2023-03-11 ENCOUNTER — Ambulatory Visit: Payer: Commercial Managed Care - PPO | Admitting: Nurse Practitioner

## 2023-03-11 ENCOUNTER — Encounter: Payer: Self-pay | Admitting: Nurse Practitioner

## 2023-03-11 VITALS — BP 134/72 | HR 76 | Temp 98.9°F | Ht 69.0 in | Wt 278.0 lb

## 2023-03-11 DIAGNOSIS — I152 Hypertension secondary to endocrine disorders: Secondary | ICD-10-CM | POA: Diagnosis not present

## 2023-03-11 DIAGNOSIS — E1159 Type 2 diabetes mellitus with other circulatory complications: Secondary | ICD-10-CM

## 2023-03-11 DIAGNOSIS — Z7985 Long-term (current) use of injectable non-insulin antidiabetic drugs: Secondary | ICD-10-CM | POA: Diagnosis not present

## 2023-03-11 DIAGNOSIS — E559 Vitamin D deficiency, unspecified: Secondary | ICD-10-CM

## 2023-03-11 DIAGNOSIS — E119 Type 2 diabetes mellitus without complications: Secondary | ICD-10-CM

## 2023-03-11 MED ORDER — TIRZEPATIDE 7.5 MG/0.5ML ~~LOC~~ SOAJ
7.5000 mg | SUBCUTANEOUS | 1 refills | Status: DC
Start: 2023-03-11 — End: 2023-03-15

## 2023-03-11 NOTE — Assessment & Plan Note (Signed)
Chronic. Completed once weekly dosing. Advised to start daily over the counter supplement. Vitamin D level on 12/09/2022- 50.41. Will continue to monitor.

## 2023-03-11 NOTE — Assessment & Plan Note (Signed)
Chronic. Stable on Norvasc and Lostartan-HCTZ daily. Continue. Encouraged patient to continue checking blood pressure occasionally at home. Will continue to monitor.

## 2023-03-11 NOTE — Assessment & Plan Note (Signed)
Chronic. Stable on Mounjaro 7.5 mg weekly. He would like to continue at this dose. Refills sent. Last A1c- 6.0. Encouraged healthy diet and exercise. He will contact if he would like to increase his dose. Will continue to monitor.

## 2023-03-11 NOTE — Progress Notes (Signed)
Bethanie Dicker, NP-C Phone: [phone]  CADE SWAYZER is a 52 y.o. male who presents today for follow up.   HYPERTENSION Disease Monitoring Home BP Monitoring- 130s/70s Chest pain- No    Dyspnea- No Medications Compliance-  Norvasc, Losartan-HCTZ. Lightheadedness-  No  Edema- No BMET    Component Value Date/Time   NA 142 12/09/2022 0920   K 4.1 12/09/2022 0920   CL 103 12/09/2022 0920   CO2 29 12/09/2022 0920   GLUCOSE 85 12/09/2022 0920   BUN 10 12/09/2022 0920   CREATININE 1.00 12/09/2022 0920   CALCIUM 9.6 12/09/2022 0920   DIABETES Disease Monitoring: Blood Sugar ranges- Not checking Polyuria/phagia/dipsia- No      Optho- Yes Medications: Compliance- Mounjaro 7.5 mg weekly Hypoglycemic symptoms- No Lab Results  Component Value Date   HGBA1C 6.0 12/09/2022    Social History   Tobacco Use  Smoking Status Never  Smokeless Tobacco Never    Current Outpatient Medications on File Prior to Visit  Medication Sig Dispense Refill   amLODipine (NORVASC) 10 MG tablet Take 1 tablet (10 mg total) by mouth daily. 90 tablet 3   losartan-hydrochlorothiazide (HYZAAR) 100-25 MG tablet Take 1 tablet by mouth daily. In am 90 tablet 3   No current facility-administered medications on file prior to visit.    ROS see history of present illness  Objective  Physical Exam Vitals:   03/11/23 0802  BP: 134/72  Pulse: 76  Temp: 98.9 F (37.2 C)  SpO2: 96%    BP Readings from Last 3 Encounters:  03/11/23 134/72  12/09/22 130/70  07/05/22 129/81   Wt Readings from Last 3 Encounters:  03/11/23 278 lb (126.1 kg)  12/09/22 282 lb 6.4 oz (128.1 kg)  07/05/22 279 lb 6.4 oz (126.7 kg)    Physical Exam Constitutional:      General: He is not in acute distress.    Appearance: Normal appearance.  HENT:     Head: Normocephalic.  Cardiovascular:     Rate and Rhythm: Normal rate and regular rhythm.     Heart sounds: Normal heart sounds.  Pulmonary:     Effort: Pulmonary  effort is normal.     Breath sounds: Normal breath sounds.  Skin:    General: Skin is warm and dry.  Neurological:     General: No focal deficit present.     Mental Status: He is alert.  Psychiatric:        Mood and Affect: Mood normal.        Behavior: Behavior normal.    Assessment/Plan: Please see individual problem list.  Type 2 diabetes mellitus without complication, without long-term current use of insulin (HCC) Assessment & Plan: Chronic. Stable on Mounjaro 7.5 mg weekly. He would like to continue at this dose. Refills sent. Last A1c- 6.0. Encouraged healthy diet and exercise. He will contact if he would like to increase his dose. Will continue to monitor.   Orders: -     Tirzepatide; Inject 7.5 mg into the skin once a week.  Dispense: 6 mL; Refill: 1  Hypertension associated with diabetes (HCC) Assessment & Plan: Chronic. Stable on Norvasc and Lostartan-HCTZ daily. Continue. Encouraged patient to continue checking blood pressure occasionally at home. Will continue to monitor.    Vitamin D deficiency Assessment & Plan: Chronic. Completed once weekly dosing. Advised to start daily over the counter supplement. Vitamin D level on 12/09/2022- 50.41. Will continue to monitor.     Return in about 6 months (around 09/11/2023)  for Follow up.   Bethanie Dicker, NP-C [facility] - [employer]

## 2023-03-15 ENCOUNTER — Telehealth: Payer: Self-pay | Admitting: Nurse Practitioner

## 2023-03-15 ENCOUNTER — Other Ambulatory Visit: Payer: Self-pay

## 2023-03-15 DIAGNOSIS — E119 Type 2 diabetes mellitus without complications: Secondary | ICD-10-CM

## 2023-03-15 MED ORDER — TIRZEPATIDE 7.5 MG/0.5ML ~~LOC~~ SOAJ
7.5000 mg | SUBCUTANEOUS | 1 refills | Status: DC
Start: 2023-03-15 — End: 2023-06-17
  Filled 2023-03-15: qty 2, 28d supply, fill #0
  Filled 2023-04-11: qty 2, 28d supply, fill #1
  Filled 2023-06-06: qty 2, 28d supply, fill #2

## 2023-03-15 NOTE — Telephone Encounter (Signed)
Called and spoke with pt and informed him it was sent on the day of his appt but it was sent to [facility] instead, I was able to send it to [facility] his preferred pharmacy.

## 2023-03-15 NOTE — Telephone Encounter (Signed)
Prescription Request  03/15/2023  LOV: 03/11/2023  What is the name of the medication or equipment? [facility]   Have you contacted your pharmacy to request a refill? No   Which pharmacy would you like this sent to?  [facility] pharmacy  Patient notified that their request is being sent to the clinical staff for review and that they should receive a response within 2 business days.   Please advise at Mobile [phone] (mobile)

## 2023-03-28 ENCOUNTER — Other Ambulatory Visit: Payer: Self-pay

## 2023-06-17 ENCOUNTER — Telehealth: Payer: Self-pay | Admitting: Nurse Practitioner

## 2023-06-17 ENCOUNTER — Other Ambulatory Visit: Payer: Self-pay

## 2023-06-17 MED ORDER — TIRZEPATIDE 10 MG/0.5ML ~~LOC~~ SOAJ
10.0000 mg | SUBCUTANEOUS | 1 refills | Status: DC
  Filled 2023-06-17 – 2023-07-08 (×3): qty 2, 28d supply, fill #0
  Filled 2023-08-11: qty 2, 28d supply, fill #1

## 2023-06-17 NOTE — Telephone Encounter (Signed)
10 mg has been sent pt has been informed

## 2023-06-17 NOTE — Telephone Encounter (Signed)
Patient would like to have his tirzepatide [facility]) 7.5 MG/0.5ML Pen increased, current dosage is not really working for him.

## 2023-06-20 ENCOUNTER — Other Ambulatory Visit: Payer: Self-pay

## 2023-07-08 ENCOUNTER — Other Ambulatory Visit: Payer: Self-pay

## 2023-09-13 ENCOUNTER — Ambulatory Visit: Payer: Commercial Managed Care - PPO | Admitting: Nurse Practitioner

## 2023-09-21 ENCOUNTER — Encounter: Payer: Self-pay | Admitting: Nurse Practitioner

## 2023-09-21 ENCOUNTER — Other Ambulatory Visit: Payer: Self-pay | Admitting: Nurse Practitioner

## 2023-09-21 ENCOUNTER — Ambulatory Visit: Payer: Commercial Managed Care - PPO | Admitting: Nurse Practitioner

## 2023-09-21 ENCOUNTER — Other Ambulatory Visit: Payer: Self-pay

## 2023-09-21 ENCOUNTER — Telehealth: Payer: Self-pay

## 2023-09-21 VITALS — BP 122/78 | HR 68 | Temp 98.0°F | Ht 69.0 in | Wt 283.4 lb

## 2023-09-21 DIAGNOSIS — E119 Type 2 diabetes mellitus without complications: Secondary | ICD-10-CM

## 2023-09-21 DIAGNOSIS — E1159 Type 2 diabetes mellitus with other circulatory complications: Secondary | ICD-10-CM

## 2023-09-21 DIAGNOSIS — I152 Hypertension secondary to endocrine disorders: Secondary | ICD-10-CM | POA: Diagnosis not present

## 2023-09-21 LAB — COMPREHENSIVE METABOLIC PANEL
ALT: 18 U/L (ref 0–53)
AST: 16 U/L (ref 0–37)
Albumin: 4.7 g/dL (ref 3.5–5.2)
Alkaline Phosphatase: 74 U/L (ref 39–117)
BUN: 9 mg/dL (ref 6–23)
CO2: 31 meq/L (ref 19–32)
Calcium: 9.7 mg/dL (ref 8.4–10.5)
Chloride: 103 meq/L (ref 96–112)
Creatinine, Ser: 0.95 mg/dL (ref 0.40–1.50)
GFR: 90.65 mL/min (ref >60.00)
Glucose, Bld: 87 mg/dL (ref 70–99)
Potassium: 4.2 meq/L (ref 3.5–5.1)
Sodium: 142 meq/L (ref 135–145)
Total Bilirubin: 0.5 mg/dL (ref 0.2–1.2)
Total Protein: 6.8 g/dL (ref 6.0–8.3)

## 2023-09-21 LAB — HEMOGLOBIN A1C: Hgb A1c MFr Bld: 6.3 % (ref 4.6–6.5)

## 2023-09-21 MED ORDER — MOUNJARO 10 MG/0.5ML ~~LOC~~ SOAJ
10.0000 mg | SUBCUTANEOUS | 1 refills | Status: DC
  Filled 2023-09-21: qty 2, 28d supply, fill #0
  Filled 2023-10-26: qty 2, 28d supply, fill #1

## 2023-09-21 NOTE — Assessment & Plan Note (Signed)
 The patient is currently on Norvasc , Losartan , and Hydrochlorothiazide with no adverse effects. Blood pressure is 122/78. Continue current medications. Check CMP today.

## 2023-09-21 NOTE — Assessment & Plan Note (Signed)
 The patient has been off Mounjaro  for two weeks due to cost, with the last A1C recorded at 6.0. No excessive thirst or urination is reported. We will work with the pharmacy to explore prior authorization or other measures to reduce Mounjaro 's cost. If continuation is not feasible, consider switching to an oral medication. Check A1C today.

## 2023-09-21 NOTE — Progress Notes (Signed)
 Bluford Burkitt, NP-C Phone: [phone]  Darren Perkins is a 53 y.o. male who presents today for follow up.   Discussed the use of AI scribe software for clinical note transcription with the patient, who gave verbal consent to proceed.  History of Present Illness   The patient, with a history of diabetes, hypertension, presents for a six-month follow-up. He reports no new health concerns or symptoms. He has been without his diabetes medication, Mounjaro , for two weeks due to high pharmacy costs. He expresses concern about the effectiveness of restarting the medication after a prolonged period without it. He also mentions a change in injection site from the stomach to the leg has improved the medication's effectiveness.  The patient admits to a regular diet, including carbohydrates and sugars, and has not experienced any weight loss despite being on Mounjaro . He denies excessive thirst but reports increased urination, which he attributes to his diuretic medication. He has not seen an eye doctor recently and does not regularly check his blood pressure at home, but reports it was good on the morning of the visit. He denies chest pain, shortness of breath, dizziness, and swelling. He is currently on Norvasc , Losartan , and Hydrochlorothiazide for blood pressure control, and reports no issues with these medications.  The patient also mentions a recent purchase of a new vehicle, which he describes as a positive event. He is currently working on restructuring his insurance due to a life event, which he hopes will help with the cost of his diabetes medication.      Social History   Tobacco Use  Smoking Status Never  Smokeless Tobacco Never    Current Outpatient Medications on File Prior to Visit  Medication Sig Dispense Refill   amLODipine  (NORVASC ) 10 MG tablet Take 1 tablet (10 mg total) by mouth daily. 90 tablet 3   losartan -hydrochlorothiazide (HYZAAR) 100-25 MG tablet Take 1 tablet by mouth  daily. In am 90 tablet 3   tirzepatide  (MOUNJARO ) 10 MG/0.5ML Pen Inject 10 mg into the skin once a week. 2 mL 1   No current facility-administered medications on file prior to visit.    ROS see history of present illness  Objective  Physical Exam Vitals:   09/21/23 0811  BP: 122/78  Pulse: 68  Temp: 98 F (36.7 C)  SpO2: 98%    BP Readings from Last 3 Encounters:  09/21/23 122/78  03/11/23 134/72  12/09/22 130/70   Wt Readings from Last 3 Encounters:  09/21/23 283 lb 6.4 oz (128.5 kg)  03/11/23 278 lb (126.1 kg)  12/09/22 282 lb 6.4 oz (128.1 kg)    Physical Exam Constitutional:      General: He is not in acute distress.    Appearance: Normal appearance.  HENT:     Head: Normocephalic.  Cardiovascular:     Rate and Rhythm: Normal rate and regular rhythm.     Heart sounds: Normal heart sounds.  Pulmonary:     Effort: Pulmonary effort is normal.     Breath sounds: Normal breath sounds.  Skin:    General: Skin is warm and dry.  Neurological:     General: No focal deficit present.     Mental Status: He is alert.  Psychiatric:        Mood and Affect: Mood normal.        Behavior: Behavior normal.    Assessment/Plan: Please see individual problem list.   Type 2 diabetes mellitus without complication, without long-term current use of insulin (HCC)  Assessment & Plan: The patient has been off Mounjaro  for two weeks due to cost, with the last A1C recorded at 6.0. No excessive thirst or urination is reported. We will work with the pharmacy to explore prior authorization or other measures to reduce Mounjaro 's cost. If continuation is not feasible, consider switching to an oral medication. Check A1C today.   Orders: -     Hemoglobin A1c  Hypertension associated with diabetes (HCC) Assessment & Plan: The patient is currently on Norvasc , Losartan , and Hydrochlorothiazide with no adverse effects. Blood pressure is 122/78. Continue current medications. Check CMP today.    Orders: -     Comprehensive metabolic panel   Return in about 6 months (around 03/20/2024) for Follow up.   Bluford Burkitt, NP-C [facility] - [facility]

## 2023-09-21 NOTE — Telephone Encounter (Signed)
Pt needs a PA on Mounjaro-

## 2023-09-27 ENCOUNTER — Other Ambulatory Visit (HOSPITAL_COMMUNITY): Payer: Self-pay

## 2023-10-12 ENCOUNTER — Other Ambulatory Visit: Payer: Self-pay

## 2023-10-12 MED ORDER — TIRZEPATIDE 12.5 MG/0.5ML ~~LOC~~ SOAJ
12.5000 mg | SUBCUTANEOUS | 0 refills | Status: DC
  Filled 2023-10-12: qty 2, 28d supply, fill #0

## 2023-10-24 ENCOUNTER — Other Ambulatory Visit: Payer: Self-pay | Admitting: Nurse Practitioner

## 2023-10-24 ENCOUNTER — Other Ambulatory Visit: Payer: Self-pay

## 2023-10-25 ENCOUNTER — Other Ambulatory Visit: Payer: Self-pay

## 2023-10-25 MED FILL — Tirzepatide Soln Auto-injector 12.5 MG/0.5ML: SUBCUTANEOUS | 28 days supply | Qty: 2 | Fill #0 | Status: CN

## 2023-10-26 ENCOUNTER — Other Ambulatory Visit: Payer: Self-pay

## 2023-10-26 MED FILL — Tirzepatide Soln Auto-injector 12.5 MG/0.5ML: SUBCUTANEOUS | 28 days supply | Qty: 2 | Fill #0 | Status: AC

## 2023-11-02 ENCOUNTER — Other Ambulatory Visit: Payer: Self-pay

## 2023-11-16 ENCOUNTER — Ambulatory Visit (HOSPITAL_BASED_OUTPATIENT_CLINIC_OR_DEPARTMENT_OTHER)

## 2023-11-16 ENCOUNTER — Ambulatory Visit (HOSPITAL_BASED_OUTPATIENT_CLINIC_OR_DEPARTMENT_OTHER): Admitting: Orthopaedic Surgery

## 2023-11-16 DIAGNOSIS — M7661 Achilles tendinitis, right leg: Secondary | ICD-10-CM | POA: Diagnosis not present

## 2023-11-16 NOTE — Addendum Note (Signed)
 Addended by: Jeanella Cara on: 11/16/2023 02:40 PM   Modules accepted: Orders

## 2023-11-16 NOTE — Progress Notes (Signed)
 Chief Complaint: Right ankle pain     History of Present Illness:    Darren Perkins is a 53 y.o. male presents today with ongoing right ankle pain for the last several years.  Does not have any history of surgeries.  He states that this has been chronic and worse when he has been more active.  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  He does do significant walking as result of this.  He is experiencing the pain predominantly about the posterior Achilles complex.    PMH/PSH/Family History/Social History/Meds/Allergies:    Past Medical History:  Diagnosis Date   Hypertension    Past Surgical History:  Procedure Laterality Date   COLONOSCOPY WITH PROPOFOL N/A 03/17/2020   Procedure: COLONOSCOPY WITH PROPOFOL;  Surgeon: Toledo, Boykin Nearing, MD;  Location: [facility] ENDOSCOPY;  Service: Gastroenterology;  Laterality: N/A;   LAPAROSCOPIC GASTRIC BANDING     Dr. Bufford Lope [place] 2008    Social History   Socioeconomic History   Marital status: Married    Spouse name: Not on file   Number of children: Not on file   Years of education: Not on file   Highest education level: Associate degree: occupational, Scientist, product/process development, or vocational program  Occupational History   Not on file  Tobacco Use   Smoking status: Never   Smokeless tobacco: Never  Vaping Use   Vaping status: Never Used  Substance and Sexual Activity   Alcohol use: Not Currently   Drug use: Not Currently   Sexual activity: Yes    Comment: wife   Other Topics Concern   Not on file  Social History Narrative   Wears seat belt, safe in relationship    Associates degree, 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    [name]    Son and daughter    Social Drivers of Corporate investment banker Strain: Low Risk  (09/20/2023)   Overall Financial Resource Strain (CARDIA)    Difficulty of Paying Living Expenses: Not hard at all  Food Insecurity: No Food Insecurity (09/20/2023)    Hunger Vital Sign    Worried About Running Out of Food in the Last Year: Never true    Ran Out of Food in the Last Year: Never true  Transportation Needs: No Transportation Needs (09/20/2023)   PRAPARE - Administrator, Civil Service (Medical): No    Lack of Transportation (Non-Medical): No  Physical Activity: Sufficiently Active (09/20/2023)   Exercise Vital Sign    Days of Exercise per Week: 5 days    Minutes of Exercise per Session: 30 min  Stress: No Stress Concern Present (09/20/2023)   [name] of Occupational Health - Occupational Stress Questionnaire    Feeling of Stress : Only a little  Social Connections: Moderately Integrated (09/20/2023)   Social Connection and Isolation Panel [NHANES]    Frequency of Communication with Friends and Family: Twice a week    Frequency of Social Gatherings with Friends and Family: Once a week    Attends Religious Services: 1 to 4 times per year    Active Member of [employer] or Organizations: No    Attends Engineer, structural: Not on file    Marital Status: Married   Family History  Problem Relation Age of Onset   Cancer Mother    Arthritis Mother    Hypertension Mother    Alcohol abuse Father    Diabetes Father  Cancer Brother    Hypertension Brother    Hypertension Brother    Bipolar disorder Daughter    No Known Allergies Current Outpatient Medications  Medication Sig Dispense Refill   amLODipine (NORVASC) 10 MG tablet Take 1 tablet (10 mg total) by mouth daily. 90 tablet 3   losartan-hydrochlorothiazide (HYZAAR) 100-25 MG tablet Take 1 tablet by mouth daily. In am 90 tablet 3   tirzepatide (MOUNJARO) 12.5 MG/0.5ML Pen Inject 12.5 mg into the skin once a week. 2 mL 0   tirzepatide (MOUNJARO) 10 MG/0.5ML Pen Inject 10 mg into the skin once a week. 2 mL 1   No current facility-administered medications for this visit.   No results found.  Review of Systems:   A ROS was performed including pertinent  positives and negatives as documented in the HPI.  Physical Exam :   Constitutional: NAD and appears stated age Neurological: Alert and oriented Psych: Appropriate affect and cooperative There were no vitals taken for this visit.   Comprehensive Musculoskeletal Exam:    Tenderness to palpation about the Achilles as it inserts onto the calcaneus.  Dorsiflexion is to approximately 5 degrees compared to 10 contralaterally.   Imaging:   Xray (3 views right ankle): Significant Haglund deformity with an insertional osteophyte about the Achilles     I personally reviewed and interpreted the radiographs.   Assessment and Plan:   53 y.o. 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  I did discuss a stretching program which she will begin as well as orthotic usage.  He will plan to obtain this.  I would like to obtain an MRI to see if there is any insertional tearing versus tendinosis.  I will see him back following discuss results  - Plan for MRI right ankle and follow-up to discuss results   I personally saw and evaluated the patient, and participated in the management and treatment plan.  Huel Cote, MD Attending Physician, Orthopedic Surgery  This document was dictated using Dragon voice recognition software. A reasonable attempt at proof reading has been made to minimize errors.

## 2023-11-24 ENCOUNTER — Ambulatory Visit
Admission: RE | Admit: 2023-11-24 | Discharge: 2023-11-24 | Disposition: A | Discharge status: Home / Self Care | Source: Ambulatory Visit | Attending: Orthopaedic Surgery | Admitting: Orthopaedic Surgery

## 2023-11-24 DIAGNOSIS — M7661 Achilles tendinitis, right leg: Secondary | ICD-10-CM

## 2023-12-04 ENCOUNTER — Other Ambulatory Visit: Payer: Self-pay | Admitting: Nurse Practitioner

## 2023-12-05 ENCOUNTER — Other Ambulatory Visit: Payer: Self-pay

## 2023-12-05 MED ORDER — TIRZEPATIDE 15 MG/0.5ML ~~LOC~~ SOAJ
15.0000 mg | SUBCUTANEOUS | 6 refills | Status: DC
  Filled 2023-12-05 – 2023-12-27 (×5): qty 2, 28d supply, fill #0
  Filled 2024-01-12 – 2024-01-17 (×2): qty 2, 28d supply, fill #1
  Filled 2024-02-08: qty 2, 28d supply, fill #2
  Filled 2024-03-07: qty 2, 28d supply, fill #3
  Filled 2024-04-04: qty 2, 28d supply, fill #4
  Filled 2024-05-02: qty 2, 28d supply, fill #5
  Filled 2024-05-31: qty 2, 28d supply, fill #6

## 2023-12-14 ENCOUNTER — Ambulatory Visit (HOSPITAL_BASED_OUTPATIENT_CLINIC_OR_DEPARTMENT_OTHER): Admitting: Orthopaedic Surgery

## 2023-12-14 ENCOUNTER — Ambulatory Visit (HOSPITAL_BASED_OUTPATIENT_CLINIC_OR_DEPARTMENT_OTHER): Payer: Self-pay | Admitting: Orthopaedic Surgery

## 2023-12-14 DIAGNOSIS — M7661 Achilles tendinitis, right leg: Secondary | ICD-10-CM

## 2023-12-14 NOTE — Progress Notes (Signed)
 Chief Complaint: Right ankle pain     History of Present Illness:   12/14/2023: Presents today for MRI follow-up and further discussion.  He has been trialing bracing without any relief.  Darren Perkins is a 53 y.o. male presents today with ongoing right ankle pain for the last several years.  Does not have any history of surgeries.  He states that this has been chronic and worse when he has been more active.  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  He does do significant walking as result of this.  He is experiencing the pain predominantly about the posterior Achilles complex.    PMH/PSH/Family History/Social History/Meds/Allergies:    Past Medical History:  Diagnosis Date   Hypertension    Past Surgical History:  Procedure Laterality Date   COLONOSCOPY WITH PROPOFOL N/A 03/17/2020   Procedure: COLONOSCOPY WITH PROPOFOL;  Surgeon: Toledo, Boykin Nearing, MD;  Location: [facility] ENDOSCOPY;  Service: Gastroenterology;  Laterality: N/A;   LAPAROSCOPIC GASTRIC BANDING     Dr. Bufford Lope [place] 2008    Social History   Socioeconomic History   Marital status: Married    Spouse name: Not on file   Number of children: Not on file   Years of education: Not on file   Highest education level: Associate degree: occupational, Scientist, product/process development, or vocational program  Occupational History   Not on file  Tobacco Use   Smoking status: Never   Smokeless tobacco: Never  Vaping Use   Vaping status: Never Used  Substance and Sexual Activity   Alcohol use: Not Currently   Drug use: Not Currently   Sexual activity: Yes    Comment: wife   Other Topics Concern   Not on file  Social History Narrative   Wears seat belt, safe in relationship    Associates degree, 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    [name]    Son and daughter    Social Drivers of Corporate investment banker Strain: Low Risk  (09/20/2023)   Overall Financial Resource Strain  (CARDIA)    Difficulty of Paying Living Expenses: Not hard at all  Food Insecurity: No Food Insecurity (09/20/2023)   Hunger Vital Sign    Worried About Running Out of Food in the Last Year: Never true    Ran Out of Food in the Last Year: Never true  Transportation Needs: No Transportation Needs (09/20/2023)   PRAPARE - Administrator, Civil Service (Medical): No    Lack of Transportation (Non-Medical): No  Physical Activity: Sufficiently Active (09/20/2023)   Exercise Vital Sign    Days of Exercise per Week: 5 days    Minutes of Exercise per Session: 30 min  Stress: No Stress Concern Present (09/20/2023)   [name] of Occupational Health - Occupational Stress Questionnaire    Feeling of Stress : Only a little  Social Connections: Moderately Integrated (09/20/2023)   Social Connection and Isolation Panel [NHANES]    Frequency of Communication with Friends and Family: Twice a week    Frequency of Social Gatherings with Friends and Family: Once a week    Attends Religious Services: 1 to 4 times per year    Active Member of [employer] or Organizations: No    Attends Engineer, structural: Not on file    Marital Status: Married   Family History  Problem Relation Age of Onset   Cancer Mother  Arthritis Mother    Hypertension Mother    Alcohol abuse Father    Diabetes Father    Cancer Brother    Hypertension Brother    Hypertension Brother    Bipolar disorder Daughter    No Known Allergies Current Outpatient Medications  Medication Sig Dispense Refill   amLODipine (NORVASC) 10 MG tablet Take 1 tablet (10 mg total) by mouth daily. 90 tablet 3   losartan-hydrochlorothiazide (HYZAAR) 100-25 MG tablet Take 1 tablet by mouth daily. In am 90 tablet 3   tirzepatide (MOUNJARO) 15 MG/0.5ML Pen Inject 15 mg into the skin once a week. 2 mL 6   No current facility-administered medications for this visit.   No results found.  Review of Systems:   A ROS was performed  including pertinent positives and negatives as documented in the HPI.  Physical Exam :   Constitutional: NAD and appears stated age Neurological: Alert and oriented Psych: Appropriate affect and cooperative There were no vitals taken for this visit.   Comprehensive Musculoskeletal Exam:    Tenderness to palpation about the Achilles as it inserts onto the calcaneus.  Dorsiflexion is to approximately 5 degrees compared to 10 contralaterally.   Imaging:   Xray (3 views right ankle): Significant Haglund deformity with an insertional osteophyte about the Achilles  MRI right ankle: Significant Haglund lesion with undersurface tearing of the Achilles complex   I personally reviewed and interpreted the radiographs.   Assessment and Plan:   53 y.o. 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  I did discuss that this is quite significant and he does have unfortunately undersurface tearing where the Haglund lesion is located next to his Achilles.  He has trialed bracing and shoe modification.  He is not getting any relief from this.  Given this we did discuss additional possible treatments such as shockwave therapy or PRP.  I did discuss surgical intervention.  I did discuss risks and limitations.  Like to further consider his options  -And for likely right ankle Achilles repair with Haglund lesion resection   After a lengthy discussion of treatment options, including risks, benefits, alternatives, complications of surgical and nonsurgical conservative options, the patient elected surgical repair.   The patient  is aware of the material risks  and complications including, but not limited to injury to adjacent structures, neurovascular injury, infection, numbness, bleeding, implant failure, thermal burns, stiffness, persistent pain, failure to heal, disease transmission from allograft, need for further surgery, dislocation, anesthetic  risks, blood clots, risks of death,and others. The probabilities of surgical success and failure discussed with patient given their particular co-morbidities.The time and nature of expected rehabilitation and recovery was discussed.The patient's questions were all answered preoperatively.  No barriers to understanding were noted. I explained the natural history of the disease process and Rx rationale.  I explained to the patient what I considered to be reasonable expectations given their personal situation.  The final treatment plan was arrived at through a shared patient decision making process model.    I personally saw and evaluated the patient, and participated in the management and treatment plan.  Huel Cote, MD Attending Physician, Orthopedic Surgery  This document was dictated using Dragon voice recognition software. A reasonable attempt at proof reading has been made to minimize errors.

## 2023-12-16 ENCOUNTER — Other Ambulatory Visit: Payer: Self-pay

## 2023-12-21 ENCOUNTER — Other Ambulatory Visit: Payer: Self-pay

## 2023-12-23 ENCOUNTER — Other Ambulatory Visit: Payer: Self-pay

## 2023-12-26 ENCOUNTER — Other Ambulatory Visit (HOSPITAL_COMMUNITY): Payer: Self-pay

## 2023-12-26 ENCOUNTER — Telehealth: Payer: Self-pay

## 2023-12-26 NOTE — Telephone Encounter (Signed)
 Pharmacy Patient Advocate Encounter  Received notification from [employer] that Prior Authorization for MOUNJARO  15 MG/0.5 ML PEN  has been APPROVED from 12/26/23 to 12/24/24. Ran test claim, Copay is $207.79. This test claim was processed through [facility]- copay amounts may vary at other pharmacies due to pharmacy/plan contracts, or as the patient moves through the different stages of their insurance plan.   PA #/Case ID/Reference #: [id number]

## 2023-12-26 NOTE — Telephone Encounter (Signed)
 Pharmacy Patient Advocate Encounter   Received notification from Onbase that prior authorization for MOUNJARO  15 MG/0.5 ML PEN is required/requested.   Insurance verification completed.   The patient is insured through [facility] .   Per test claim: PA required; PA submitted to above mentioned insurance via Prompt PA Key/confirmation #/[id number] Status is pending

## 2023-12-27 ENCOUNTER — Other Ambulatory Visit: Payer: Self-pay

## 2024-01-12 ENCOUNTER — Other Ambulatory Visit: Payer: Self-pay

## 2024-01-17 ENCOUNTER — Other Ambulatory Visit: Payer: Self-pay

## 2024-01-17 ENCOUNTER — Other Ambulatory Visit: Payer: Self-pay | Admitting: Nurse Practitioner

## 2024-01-17 DIAGNOSIS — E1159 Type 2 diabetes mellitus with other circulatory complications: Secondary | ICD-10-CM

## 2024-02-01 ENCOUNTER — Other Ambulatory Visit: Payer: Self-pay | Admitting: Nurse Practitioner

## 2024-02-01 DIAGNOSIS — E1159 Type 2 diabetes mellitus with other circulatory complications: Secondary | ICD-10-CM

## 2024-02-08 ENCOUNTER — Other Ambulatory Visit: Payer: Self-pay

## 2024-03-07 ENCOUNTER — Other Ambulatory Visit: Payer: Self-pay

## 2024-03-20 ENCOUNTER — Ambulatory Visit: Payer: Commercial Managed Care - PPO | Admitting: Nurse Practitioner

## 2024-03-20 ENCOUNTER — Encounter: Payer: Self-pay | Admitting: Nurse Practitioner

## 2024-03-20 VITALS — BP 116/68 | HR 75 | Temp 98.4°F | Ht 69.0 in | Wt 249.4 lb

## 2024-03-20 DIAGNOSIS — E559 Vitamin D deficiency, unspecified: Secondary | ICD-10-CM

## 2024-03-20 DIAGNOSIS — I152 Hypertension secondary to endocrine disorders: Secondary | ICD-10-CM

## 2024-03-20 DIAGNOSIS — Z13220 Encounter for screening for lipoid disorders: Secondary | ICD-10-CM

## 2024-03-20 DIAGNOSIS — Z125 Encounter for screening for malignant neoplasm of prostate: Secondary | ICD-10-CM | POA: Diagnosis not present

## 2024-03-20 DIAGNOSIS — Z7985 Long-term (current) use of injectable non-insulin antidiabetic drugs: Secondary | ICD-10-CM

## 2024-03-20 DIAGNOSIS — E119 Type 2 diabetes mellitus without complications: Secondary | ICD-10-CM

## 2024-03-20 DIAGNOSIS — Z114 Encounter for screening for human immunodeficiency virus [HIV]: Secondary | ICD-10-CM

## 2024-03-20 DIAGNOSIS — Z1329 Encounter for screening for other suspected endocrine disorder: Secondary | ICD-10-CM

## 2024-03-20 DIAGNOSIS — Z20828 Contact with and (suspected) exposure to other viral communicable diseases: Secondary | ICD-10-CM

## 2024-03-20 DIAGNOSIS — E1159 Type 2 diabetes mellitus with other circulatory complications: Secondary | ICD-10-CM | POA: Diagnosis not present

## 2024-03-20 LAB — LIPID PANEL
Cholesterol: 169 mg/dL (ref 0–200)
HDL: 49.5 mg/dL (ref >39.00)
LDL Cholesterol: 103 mg/dL — ABNORMAL HIGH (ref 0–99)
NonHDL: 119.88
Total CHOL/HDL Ratio: 3
Triglycerides: 84 mg/dL (ref 0.0–149.0)
VLDL: 16.8 mg/dL (ref 0.0–40.0)

## 2024-03-20 LAB — MICROALBUMIN / CREATININE URINE RATIO
Creatinine,U: 161.5 mg/dL
Microalb Creat Ratio: UNDETERMINED mg/g (ref 0.0–30.0)
Microalb, Ur: <0.7 mg/dL

## 2024-03-20 LAB — CBC WITH DIFFERENTIAL/PLATELET
Basophils Absolute: 0.1 K/uL (ref 0.0–0.1)
Basophils Relative: 0.5 % (ref 0.0–3.0)
Eosinophils Absolute: 0.2 K/uL (ref 0.0–0.7)
Eosinophils Relative: 1.4 % (ref 0.0–5.0)
HCT: 39.9 % (ref 39.0–52.0)
Hemoglobin: 13.2 g/dL (ref 13.0–17.0)
Lymphocytes Relative: 15.7 % (ref 12.0–46.0)
Lymphs Abs: 1.8 K/uL (ref 0.7–4.0)
MCHC: 33 g/dL (ref 30.0–36.0)
MCV: 81.3 fl (ref 78.0–100.0)
Monocytes Absolute: 1.2 K/uL — ABNORMAL HIGH (ref 0.1–1.0)
Monocytes Relative: 10.5 % (ref 3.0–12.0)
Neutro Abs: 8.2 K/uL — ABNORMAL HIGH (ref 1.4–7.7)
Neutrophils Relative %: 71.9 % (ref 43.0–77.0)
Platelets: 305 K/uL (ref 150.0–400.0)
RBC: 4.9 Mil/uL (ref 4.22–5.81)
RDW: 15.8 % — ABNORMAL HIGH (ref 11.5–15.5)
WBC: 11.5 K/uL — ABNORMAL HIGH (ref 4.0–10.5)

## 2024-03-20 LAB — COMPREHENSIVE METABOLIC PANEL WITH GFR
ALT: 14 U/L (ref 0–53)
AST: 14 U/L (ref 0–37)
Albumin: 4.7 g/dL (ref 3.5–5.2)
Alkaline Phosphatase: 71 U/L (ref 39–117)
BUN: 10 mg/dL (ref 6–23)
CO2: 32 meq/L (ref 19–32)
Calcium: 9.8 mg/dL (ref 8.4–10.5)
Chloride: 101 meq/L (ref 96–112)
Creatinine, Ser: 1.1 mg/dL (ref 0.40–1.50)
GFR: 75.76 mL/min (ref >60.00)
Glucose, Bld: 84 mg/dL (ref 70–99)
Potassium: 3.7 meq/L (ref 3.5–5.1)
Sodium: 140 meq/L (ref 135–145)
Total Bilirubin: 0.8 mg/dL (ref 0.2–1.2)
Total Protein: 6.8 g/dL (ref 6.0–8.3)

## 2024-03-20 LAB — HEMOGLOBIN A1C: Hgb A1c MFr Bld: 6 % (ref 4.6–6.5)

## 2024-03-20 LAB — VITAMIN D 25 HYDROXY (VIT D DEFICIENCY, FRACTURES): VITD: 24.38 ng/mL — ABNORMAL LOW (ref 30.00–100.00)

## 2024-03-20 LAB — PSA: PSA: 0.36 ng/mL (ref 0.10–4.00)

## 2024-03-20 LAB — TSH: TSH: 1.04 u[IU]/mL (ref 0.35–5.50)

## 2024-03-20 NOTE — Progress Notes (Signed)
 Leron Glance, NP-C Phone: [phone]  Darren Perkins is a 53 y.o. male who presents today for follow up.   Discussed the use of AI scribe software for clinical note transcription with the patient, who gave verbal consent to proceed.  History of Present Illness   Darren Perkins is a 53 year old male with hypertension and diabetes who presents for a routine follow-up visit.  He is concerned about his prostate health, noting that he has not had it checked recently. He recalls having a PSA test done last year and plans to update his labs today.  He has issues with his ankles, experiencing significant but not constant pain. He has been advised to have surgery but has delayed it due to work commitments.  He monitors his blood pressure at home, with readings around 120/80 mmHg, sometimes as low as 116/68 mmHg. He experiences occasional dizziness when bending over and standing up. No chest pain, shortness of breath, or dizziness otherwise.  He is currently taking Norvasc  (amlodipine ), losartan  hydrochlorothiazide, and Mounjaro . Mounjaro  suppresses his appetite and has contributed to weight loss, but he experiences constipation as a side effect. He has started taking fiber gummies to manage this and is considering a stool softener for more regular bowel movements.  He is interested in checking his vitamin D  levels, as he previously took a weekly supplement but has since stopped. He is aware of the potential for low vitamin D  levels due to his skin type.  He inquires about herpes testing, stating he wants to know if he has been exposed, although he has not experienced any outbreaks or symptoms. He is open to additional STD screenings but does not feel at high risk for exposure.  He has lost approximately 40 pounds since his last visit, attributing this to his work and reduced appetite from Mounjaro . He does not engage in formal exercise but remains active at work. He is mindful of maintaining  protein intake to preserve muscle mass.      Social History   Tobacco Use  Smoking Status Never  Smokeless Tobacco Never    Current Outpatient Medications on File Prior to Visit  Medication Sig Dispense Refill   amLODipine  (NORVASC ) 10 MG tablet TAKE 1 TABLET(10 MG) BY MOUTH DAILY 90 tablet 3   losartan -hydrochlorothiazide (HYZAAR) 100-25 MG tablet TAKE 1 TABLET BY MOUTH DAILY IN THE MORNING 90 tablet 3   tirzepatide  (MOUNJARO ) 15 MG/0.5ML Pen Inject 15 mg into the skin once a week. 2 mL 6   No current facility-administered medications on file prior to visit.     ROS see history of present illness  Objective  Physical Exam Vitals:   03/20/24 0808  BP: 116/68  Pulse: 75  Temp: 98.4 F (36.9 C)  SpO2: 95%    BP Readings from Last 3 Encounters:  03/20/24 116/68  09/21/23 122/78  03/11/23 134/72   Wt Readings from Last 3 Encounters:  03/20/24 249 lb 6.4 oz (113.1 kg)  09/21/23 283 lb 6.4 oz (128.5 kg)  03/11/23 278 lb (126.1 kg)    Physical Exam Constitutional:      General: He is not in acute distress.    Appearance: Normal appearance.  HENT:     Head: Normocephalic.  Cardiovascular:     Rate and Rhythm: Normal rate and regular rhythm.     Heart sounds: Normal heart sounds.  Pulmonary:     Effort: Pulmonary effort is normal.     Breath sounds: Normal breath sounds.  Skin:    General: Skin is warm and dry.  Neurological:     General: No focal deficit present.     Mental Status: He is alert.  Psychiatric:        Mood and Affect: Mood normal.        Behavior: Behavior normal.      Assessment/Plan: Please see individual problem list.   Type 2 diabetes mellitus without complication, without long-term current use of insulin (HCC) Assessment & Plan: Routine management with Mounjaro  has resulted in significant weight loss and decreased appetite. He does not monitor glucose at home and reports no symptoms of hyperglycemia or hypoglycemia. Perform a  diabetic foot exam, order urine ACR, and check A1c. Continue Mounjaro  15 mg weekly. Encourage healthy diet and remaining active.   Orders: -     Hemoglobin A1c -     Microalbumin / creatinine urine ratio  Hypertension associated with diabetes (HCC) Assessment & Plan: Blood pressure is well-controlled with amlodipine  and losartan -hydrochlorothiazide. Continue current antihypertensive medications and advise increased water intake. Check lab work as outlined.   Orders: -     Comprehensive metabolic panel with GFR -     CBC with Differential/Platelet  Exposure to herpes Assessment & Plan: Screening requested, and differences between HSV-1 and HSV-2 were explained along with the nature of the test. Order HSV-1 and HSV-2 serology tests.  Orders: -     HSV(herpes simplex vrs) 1+2 ab-IgG  Vitamin D  deficiency Assessment & Plan: Completed once weekly dosing. Not currently on supplement. Check vitamin D  level today.   Orders: -     VITAMIN D  25 Hydroxy (Vit-D Deficiency, Fractures)  Lipid screening -     Lipid panel  Thyroid disorder screen -     TSH  Screening PSA (prostate specific antigen) -     PSA  Encounter for screening for HIV -     HIV Antibody (routine testing w rflx)     Return in about 6 months (around 09/20/2024) for Follow up.   Leron Glance, NP-C [facility]

## 2024-03-21 LAB — HSV(HERPES SIMPLEX VRS) I + II AB-IGG
HSV 1 IGG,TYPE SPECIFIC AB: 32.2 {index} — ABNORMAL HIGH
HSV 2 IGG,TYPE SPECIFIC AB: 14.2 {index} — ABNORMAL HIGH

## 2024-03-21 LAB — HIV ANTIBODY (ROUTINE TESTING W REFLEX): HIV 1&2 Ab, 4th Generation: NONREACTIVE

## 2024-03-22 ENCOUNTER — Ambulatory Visit: Payer: Self-pay | Admitting: Nurse Practitioner

## 2024-03-23 ENCOUNTER — Telehealth: Payer: Self-pay

## 2024-03-23 ENCOUNTER — Other Ambulatory Visit: Payer: Self-pay | Admitting: Nurse Practitioner

## 2024-03-23 DIAGNOSIS — E1169 Type 2 diabetes mellitus with other specified complication: Secondary | ICD-10-CM

## 2024-03-23 MED ORDER — ROSUVASTATIN CALCIUM 10 MG PO TABS: 10.0000 mg | ORAL_TABLET | Freq: Every day | ORAL | 3 refills | Status: DC

## 2024-03-23 NOTE — Telephone Encounter (Signed)
 Gretel App, NP to [facility]      03/23/24  8:24 AM Med sent. He needs fasting labs in 6 weeks, orders are already in.

## 2024-03-29 ENCOUNTER — Encounter: Payer: Self-pay | Admitting: Nurse Practitioner

## 2024-03-29 DIAGNOSIS — Z20828 Contact with and (suspected) exposure to other viral communicable diseases: Secondary | ICD-10-CM | POA: Insufficient documentation

## 2024-03-29 NOTE — Assessment & Plan Note (Signed)
 Routine management with Mounjaro  has resulted in significant weight loss and decreased appetite. He does not monitor glucose at home and reports no symptoms of hyperglycemia or hypoglycemia. Perform a diabetic foot exam, order urine ACR, and check A1c. Continue Mounjaro  15 mg weekly. Encourage healthy diet and remaining active.

## 2024-03-29 NOTE — Assessment & Plan Note (Signed)
 Screening requested, and differences between HSV-1 and HSV-2 were explained along with the nature of the test. Order HSV-1 and HSV-2 serology tests.

## 2024-03-29 NOTE — Assessment & Plan Note (Signed)
 Blood pressure is well-controlled with amlodipine  and losartan -hydrochlorothiazide. Continue current antihypertensive medications and advise increased water intake. Check lab work as outlined.

## 2024-03-29 NOTE — Assessment & Plan Note (Signed)
 Completed once weekly dosing. Not currently on supplement. Check vitamin D  level today.

## 2024-04-04 ENCOUNTER — Other Ambulatory Visit: Payer: Self-pay

## 2024-05-02 ENCOUNTER — Other Ambulatory Visit: Payer: Self-pay

## 2024-05-04 ENCOUNTER — Ambulatory Visit: Payer: Self-pay | Admitting: Nurse Practitioner

## 2024-05-04 ENCOUNTER — Other Ambulatory Visit

## 2024-05-04 DIAGNOSIS — E785 Hyperlipidemia, unspecified: Secondary | ICD-10-CM | POA: Diagnosis not present

## 2024-05-04 DIAGNOSIS — E1169 Type 2 diabetes mellitus with other specified complication: Secondary | ICD-10-CM | POA: Diagnosis not present

## 2024-05-04 LAB — HEPATIC FUNCTION PANEL
ALT: 13 U/L (ref 0–53)
AST: 15 U/L (ref 0–37)
Albumin: 4.3 g/dL (ref 3.5–5.2)
Alkaline Phosphatase: 64 U/L (ref 39–117)
Bilirubin, Direct: 0.1 mg/dL (ref 0.0–0.3)
Total Bilirubin: 0.4 mg/dL (ref 0.2–1.2)
Total Protein: 6.6 g/dL (ref 6.0–8.3)

## 2024-05-04 LAB — LIPID PANEL
Cholesterol: 97 mg/dL (ref 0–200)
HDL: 46.7 mg/dL (ref >39.00)
LDL Cholesterol: 42 mg/dL (ref 0–99)
NonHDL: 50.61
Total CHOL/HDL Ratio: 2
Triglycerides: 45 mg/dL (ref 0.0–149.0)
VLDL: 9 mg/dL (ref 0.0–40.0)

## 2024-05-31 ENCOUNTER — Other Ambulatory Visit: Payer: Self-pay

## 2024-07-01 ENCOUNTER — Other Ambulatory Visit: Payer: Self-pay | Admitting: Nurse Practitioner

## 2024-07-02 ENCOUNTER — Other Ambulatory Visit: Payer: Self-pay

## 2024-07-02 MED ORDER — MOUNJARO 15 MG/0.5ML ~~LOC~~ SOAJ
15.0000 mg | SUBCUTANEOUS | 6 refills | Status: AC
  Filled 2024-07-02: qty 2, 28d supply, fill #0
  Filled 2024-07-26: qty 2, 28d supply, fill #1
  Filled 2024-08-24: qty 2, 28d supply, fill #2
  Filled 2024-09-21: qty 2, 28d supply, fill #3
  Filled 2024-09-21: qty 2, 28d supply, fill #0

## 2024-07-09 ENCOUNTER — Encounter: Payer: Self-pay | Admitting: Radiology

## 2024-07-26 ENCOUNTER — Other Ambulatory Visit: Payer: Self-pay

## 2024-09-21 ENCOUNTER — Ambulatory Visit: Admitting: Nurse Practitioner

## 2024-09-21 ENCOUNTER — Ambulatory Visit: Payer: Self-pay | Admitting: Nurse Practitioner

## 2024-09-21 ENCOUNTER — Other Ambulatory Visit: Payer: Self-pay

## 2024-09-21 ENCOUNTER — Encounter: Payer: Self-pay | Admitting: Nurse Practitioner

## 2024-09-21 ENCOUNTER — Other Ambulatory Visit (HOSPITAL_COMMUNITY): Payer: Self-pay

## 2024-09-21 VITALS — BP 122/74 | HR 73 | Temp 98.6°F | Ht 69.0 in | Wt 253.2 lb

## 2024-09-21 DIAGNOSIS — I152 Hypertension secondary to endocrine disorders: Secondary | ICD-10-CM | POA: Diagnosis not present

## 2024-09-21 DIAGNOSIS — E1159 Type 2 diabetes mellitus with other circulatory complications: Secondary | ICD-10-CM | POA: Diagnosis not present

## 2024-09-21 DIAGNOSIS — Z7985 Long-term (current) use of injectable non-insulin antidiabetic drugs: Secondary | ICD-10-CM

## 2024-09-21 DIAGNOSIS — E1169 Type 2 diabetes mellitus with other specified complication: Secondary | ICD-10-CM | POA: Diagnosis not present

## 2024-09-21 DIAGNOSIS — E119 Type 2 diabetes mellitus without complications: Secondary | ICD-10-CM

## 2024-09-21 DIAGNOSIS — E785 Hyperlipidemia, unspecified: Secondary | ICD-10-CM

## 2024-09-21 LAB — HEMOGLOBIN A1C: Hgb A1c MFr Bld: 5.9 % (ref 4.6–6.5)

## 2024-09-21 LAB — COMPREHENSIVE METABOLIC PANEL WITH GFR
ALT: 17 U/L (ref 3–53)
AST: 16 U/L (ref 5–37)
Albumin: 4.7 g/dL (ref 3.5–5.2)
Alkaline Phosphatase: 71 U/L (ref 39–117)
BUN: 10 mg/dL (ref 6–23)
CO2: 31 meq/L (ref 19–32)
Calcium: 9.8 mg/dL (ref 8.4–10.5)
Chloride: 104 meq/L (ref 96–112)
Creatinine, Ser: 1.12 mg/dL (ref 0.40–1.50)
GFR: 73.88 mL/min
Glucose, Bld: 82 mg/dL (ref 70–99)
Potassium: 4.1 meq/L (ref 3.5–5.1)
Sodium: 141 meq/L (ref 135–145)
Total Bilirubin: 0.5 mg/dL (ref 0.2–1.2)
Total Protein: 6.7 g/dL (ref 6.0–8.3)

## 2024-09-21 LAB — MICROALBUMIN / CREATININE URINE RATIO
Creatinine,U: 162.5 mg/dL
Microalb Creat Ratio: UNDETERMINED mg/g (ref 0.0–30.0)
Microalb, Ur: <0.7 mg/dL

## 2024-09-21 MED ORDER — AMLODIPINE BESYLATE 10 MG PO TABS: 10.0000 mg | ORAL_TABLET | Freq: Every day | ORAL | 3 refills | Status: AC

## 2024-09-21 MED ORDER — ROSUVASTATIN CALCIUM 10 MG PO TABS: 10.0000 mg | ORAL_TABLET | Freq: Every day | ORAL | 3 refills | Status: AC

## 2024-09-21 MED ORDER — LOSARTAN POTASSIUM-HCTZ 100-25 MG PO TABS: 1.0000 | ORAL_TABLET | Freq: Every morning | ORAL | 3 refills | Status: AC

## 2024-09-21 NOTE — Assessment & Plan Note (Signed)
 Hyperlipidemia is effectively managed with Crestor . His LDL cholesterol is well-controlled at 42 mg/dL, below the target of 70 mg/dL for diabetic patients. Discussed the benefits of statin therapy in reducing cardiovascular risk. Continue Crestor  for hyperlipidemia management.

## 2024-09-21 NOTE — Assessment & Plan Note (Signed)
 His hypertension is well-controlled with the current medication regimen. Recent blood pressure readings are within normal range. Continue the current antihypertensive regimen: amlodipine , losartan , and hydrochlorothiazide.

## 2024-09-21 NOTE — Progress Notes (Signed)
 " Leron Glance, NP-C Phone: [phone]  Darren Perkins is a 54 y.o. male who presents today for follow up.   Discussed the use of AI scribe software for clinical note transcription with the patient, who gave verbal consent to proceed.  History of Present Illness   Darren Perkins is a 54 year old male with diabetes and hyperlipidemia who presents for routine follow-up.  He is on Mounjaro  15 mg weekly for diabetes management and reports good control with no symptoms of excessive thirst, excessive urination, or hypoglycemia. He does not monitor his blood sugar at home. His last HbA1c in July was 6.0%. He mentions frequent urination but describes it as a 'good stream' and attributes it to the medication.  He started Crestor  for hyperlipidemia after his cholesterol levels were checked in August. His LDL has dropped to 42.  He is on amlodipine  and a combination of losartan  and hydrochlorothiazide for blood pressure management. He reports his blood pressure readings as 'really good,' with recent measurements of 116/60 and 122/74. No chest pain, shortness of breath, dizziness, or leg swelling. His previously painful ankle no longer hurts.  He mentions having completed a scan that morning and has sent the results via message. His PSA was checked in July and was normal.      Tobacco Use History[1]  Medications Ordered Prior to Encounter[2]   ROS see history of present illness  Objective  Physical Exam Vitals:   09/21/24 0809  BP: 122/74  Pulse: 73  Temp: 98.6 F (37 C)  SpO2: 99%    BP Readings from Last 3 Encounters:  09/21/24 122/74  03/20/24 116/68  09/21/23 122/78   Wt Readings from Last 3 Encounters:  09/21/24 253 lb 3.2 oz (114.9 kg)  03/20/24 249 lb 6.4 oz (113.1 kg)  09/21/23 283 lb 6.4 oz (128.5 kg)    Physical Exam Constitutional:      General: He is not in acute distress.    Appearance: Normal appearance.  HENT:     Head: Normocephalic.  Cardiovascular:      Rate and Rhythm: Normal rate and regular rhythm.     Heart sounds: Normal heart sounds.  Pulmonary:     Effort: Pulmonary effort is normal.     Breath sounds: Normal breath sounds.  Skin:    General: Skin is warm and dry.  Neurological:     General: No focal deficit present.     Mental Status: He is alert.  Psychiatric:        Mood and Affect: Mood normal.        Behavior: Behavior normal.      Assessment/Plan: Please see individual problem list.  Type 2 diabetes mellitus without complication, without long-term current use of insulin (HCC) Assessment & Plan: His diabetes is well-controlled with Mounjaro , as evidenced by an A1c of 6.0% in July, indicating good glycemic control. He has no symptoms of hyperglycemia or hypoglycemia. Check A1c today to monitor glycemic control and continue Mounjaro  15 mg weekly. Encourage healthy diet and regular exercise.   Orders: -     Microalbumin / creatinine urine ratio -     Hemoglobin A1c  Hypertension associated with diabetes (HCC) Assessment & Plan: His hypertension is well-controlled with the current medication regimen. Recent blood pressure readings are within normal range. Continue the current antihypertensive regimen: amlodipine , losartan , and hydrochlorothiazide.  Orders: -     amLODIPine  Besylate; Take 1 tablet (10 mg total) by mouth daily.  Dispense: 90 tablet; Refill:  3 -     Losartan  Potassium-HCTZ; Take 1 tablet by mouth every morning.  Dispense: 90 tablet; Refill: 3 -     Comprehensive metabolic panel with GFR  Hyperlipidemia associated with type 2 diabetes mellitus (HCC) Assessment & Plan: Hyperlipidemia is effectively managed with Crestor . His LDL cholesterol is well-controlled at 42 mg/dL, below the target of 70 mg/dL for diabetic patients. Discussed the benefits of statin therapy in reducing cardiovascular risk. Continue Crestor  for hyperlipidemia management.   Orders: -     Rosuvastatin  Calcium ; Take 1 tablet (10 mg  total) by mouth daily.  Dispense: 90 tablet; Refill: 3 -     Comprehensive metabolic panel with GFR     Return in about 6 months (around 03/21/2025) for Follow up.   Leron Glance, NP-C [facility] [place]     [1]  Social History Tobacco Use  Smoking Status Never  Smokeless Tobacco Never  [2]  Current Outpatient Medications on File Prior to Visit  Medication Sig Dispense Refill   tirzepatide  (MOUNJARO ) 15 MG/0.5ML Pen Inject 15 mg into the skin once a week. 2 mL 6   No current facility-administered medications on file prior to visit.   "

## 2024-09-21 NOTE — Assessment & Plan Note (Signed)
 His diabetes is well-controlled with Mounjaro , as evidenced by an A1c of 6.0% in July, indicating good glycemic control. He has no symptoms of hyperglycemia or hypoglycemia. Check A1c today to monitor glycemic control and continue Mounjaro  15 mg weekly. Encourage healthy diet and regular exercise.

## 2025-03-22 ENCOUNTER — Ambulatory Visit: Admitting: Nurse Practitioner
